# Patient Record
Sex: Female | Born: 1935 | Race: White | Hispanic: No | Smoking: Never smoker
Health system: Southern US, Community
[De-identification: ages and names within clinical notes are randomized; demographics above are authoritative.]

## PROBLEM LIST (undated history)

## (undated) DIAGNOSIS — E039 Hypothyroidism, unspecified: Secondary | ICD-10-CM

## (undated) DIAGNOSIS — F0390 Unspecified dementia without behavioral disturbance: Secondary | ICD-10-CM

## (undated) DIAGNOSIS — G250 Essential tremor: Secondary | ICD-10-CM

---

## 2018-05-30 ENCOUNTER — Emergency Department (HOSPITAL_COMMUNITY): Payer: Medicare Other

## 2018-05-30 ENCOUNTER — Emergency Department (HOSPITAL_COMMUNITY)
Admission: EM | Admit: 2018-05-30 | Discharge: 2018-05-30 | Disposition: A | Discharge status: Home / Self Care | Payer: Medicare Other | Attending: Emergency Medicine | Admitting: Emergency Medicine

## 2018-05-30 ENCOUNTER — Other Ambulatory Visit: Payer: Self-pay

## 2018-05-30 DIAGNOSIS — R0789 Other chest pain: Secondary | ICD-10-CM | POA: Diagnosis present

## 2018-05-30 LAB — COMPREHENSIVE METABOLIC PANEL
ALBUMIN: 3.2 g/dL — AB (ref 3.5–5.0)
ALK PHOS: 56 U/L (ref 38–126)
ALT: 13 U/L (ref 0–44)
ANION GAP: 8 (ref 5–15)
AST: 24 U/L (ref 15–41)
BILIRUBIN TOTAL: 0.8 mg/dL (ref 0.3–1.2)
BUN: 9 mg/dL (ref 8–23)
CALCIUM: 8.9 mg/dL (ref 8.9–10.3)
CO2: 25 mmol/L (ref 22–32)
CREATININE: 0.87 mg/dL (ref 0.44–1.00)
Chloride: 108 mmol/L (ref 98–111)
GFR calc Af Amer: >60 mL/min (ref >60)
GFR calc non Af Amer: 58 mL/min — ABNORMAL LOW (ref >60)
GLUCOSE: 97 mg/dL (ref 70–99)
Potassium: 3.9 mmol/L (ref 3.5–5.1)
SODIUM: 141 mmol/L (ref 135–145)
TOTAL PROTEIN: 6.6 g/dL (ref 6.5–8.1)

## 2018-05-30 LAB — CBC WITH DIFFERENTIAL/PLATELET
Abs Immature Granulocytes: 0 10*3/uL (ref 0.0–0.1)
BASOS ABS: 0.1 10*3/uL (ref 0.0–0.1)
BASOS PCT: 1 %
EOS ABS: 0.2 10*3/uL (ref 0.0–0.7)
EOS PCT: 3 %
HCT: 38.2 % (ref 36.0–46.0)
Hemoglobin: 12 g/dL (ref 12.0–15.0)
Immature Granulocytes: 0 %
Lymphocytes Relative: 21 %
Lymphs Abs: 1.3 10*3/uL (ref 0.7–4.0)
MCH: 29.1 pg (ref 26.0–34.0)
MCHC: 31.4 g/dL (ref 30.0–36.0)
MCV: 92.7 fL (ref 78.0–100.0)
MONO ABS: 0.6 10*3/uL (ref 0.1–1.0)
MONOS PCT: 9 %
NEUTROS ABS: 3.9 10*3/uL (ref 1.7–7.7)
Neutrophils Relative %: 66 %
Platelets: 222 10*3/uL (ref 150–400)
RBC: 4.12 MIL/uL (ref 3.87–5.11)
RDW: 14.6 % (ref 11.5–15.5)
WBC: 5.9 10*3/uL (ref 4.0–10.5)

## 2018-05-30 LAB — TROPONIN I: Troponin I: <0.03 ng/mL (ref <0.03)

## 2018-05-30 LAB — I-STAT TROPONIN, ED: TROPONIN I, POC: 0 ng/mL (ref 0.00–0.08)

## 2018-05-30 NOTE — ED Triage Notes (Signed)
[place]  Unassisted . Pt denies any CP.

## 2018-05-30 NOTE — ED Provider Notes (Signed)
Received the patient in signout from Dr. Madilyn Hookees, patient with atypical chest pain plan is for delta troponin.  Initial troponin is negative.  Awaiting second.  Repeat ECG unchanged from initial. Delta negative, d/c home.    EKG Interpretation  Date/Time:  Sunday May 30 2018 17:05:11 EDT Ventricular Rate:  69 PR Interval:    QRS Duration: 97 QT Interval:  408 QTC Calculation: 438 R Axis:   -24 Text Interpretation:  Sinus rhythm Borderline left axis deviation Abnormal R-wave progression, early transition Consider inferior infarct Baseline wander in lead(s) V2 No significant change since last tracing Confirmed by Melene PlanFloyd, Blayton Huttner [phone] on 05/30/2018 5:08:18 PM         Melene PlanFloyd, Tallie Hevia, DO 05/30/18 2320

## 2018-05-30 NOTE — ED Triage Notes (Signed)
Pt BIB [facility] from [facility] with reports of central CP for approx 1 min. CP resolved on its own. Pt now c/o generalized weakness/dizziness and L neck pain. 155/81, HR 58, RR16, 94% RA. Pt in NAD. EKG unremarkable.

## 2018-05-30 NOTE — ED Provider Notes (Signed)
[facility] EMERGENCY DEPARTMENT Provider Note   CSN: [id number] Arrival date & time: 05/30/18  1137     History   Chief Complaint Chief Complaint  Patient presents with  . Chest Pain    HPI Mia Rogers is a 84 y.o. female.  The history is provided by the patient and the EMS personnel. No language interpreter was used.  Chest Pain     Mia Rogers is a 84 y.o. female who presents to the Emergency Department complaining of chest pain.  Level V caveat due to confusion. He presents by EMS for evaluation of chest pain that occurred while she was walking her dog. Per EMS reports she had sharp central chest pain that lasted about 10 minutes. She also reported pain in the back of her neck. On ED arrival patient does not recall chest pain but the states she has chronic pain in the back of her neck. She does not recall events that occurred earlier today. [facility]. She reports not feeling well today but denies any additional symptoms.  Son, Mia Rogers. [phone] No past medical history on file.  There are no active problems to display for this patient.  past medical history of hypothyroidism and cognitive impairment  She is a non-smoker. No family history of cardiac disease.   She has a DNR. OB History   None      Home Medications    Prior to Admission medications   Not on File    Family History No family history on file.  Social History Social History   Tobacco Use  . Smoking status: Not on file  Substance Use Topics  . Alcohol use: Not on file  . Drug use: Not on file     Allergies   Patient has no allergy information on record.   Review of Systems Review of Systems  Cardiovascular: Positive for chest pain.  All other systems reviewed and are negative.    Physical Exam Updated Vital Signs BP (!) 156/77 (BP Location: Right Arm)   Pulse 72   Temp 98.2 F (36.8 C) (Oral)    Resp 17   Ht 5\' 3"  (1.6 m)   Wt 63.5 kg (140 lb)   SpO2 99%   BMI 24.80 kg/m   Physical Exam  Constitutional: She appears well-developed and well-nourished.  HENT:  Head: Normocephalic and atraumatic.  Cardiovascular: Normal rate and regular rhythm.  No murmur heard. Pulmonary/Chest: Effort normal and breath sounds normal. No respiratory distress.  Abdominal: Soft. There is no tenderness. There is no rebound and no guarding.  Musculoskeletal: She exhibits no edema or tenderness.  2+ radial pulses bilaterally  Neurological: She is alert.  Disoriented to place and time.  Resting tremor of head.    Skin: Skin is warm and dry.  Psychiatric: She has a normal mood and affect. Her behavior is normal.  Nursing note and vitals reviewed.    ED Treatments / Results  Labs (all labs ordered are listed, but only abnormal results are displayed) Labs Reviewed  COMPREHENSIVE METABOLIC PANEL - Abnormal; Notable for the following components:      Result Value   Albumin 3.2 (*)    GFR calc non Af Amer 58 (*)    All other components within normal limits  CBC WITH DIFFERENTIAL/PLATELET  TROPONIN I  I-STAT TROPONIN, ED    EKG EKG Interpretation  Date/Time:  Sunday May 30 2018  11:52:36 EDT Ventricular Rate:  56 PR Interval:    QRS Duration: 97 QT Interval:  447 QTC Calculation: 432 R Axis:   -20 Text Interpretation:  Sinus rhythm Inferior infarct, old Confirmed by Tilden Fossa [phone]) on 05/30/2018 12:10:19 PM   Radiology Dg Chest 2 View  Result Date: 05/30/2018 CLINICAL DATA:  [facility] with reports of central CP for approx 1 min. CP resolved on its own. Pt now c/o generalized weakness/dizziness and L neck pain. EXAM: CHEST - 2 VIEW COMPARISON:  None. FINDINGS: Heart size is UPPER limits normal. There is atherosclerotic calcification of the thoracic aorta. Mildly prominent interstitial markings. No overt alveolar edema or consolidation. There are numerous wedge  compression fractures in the UPPER thoracic spine. IMPRESSION: 1. Mildly prominent interstitial markings. Findings may be chronic. Doubt pulmonary edema. 2. Aortic atherosclerosis.  (ICD10-I70.0) 3. Thoracic compression fractures. Electronically Signed   By: Norva Pavlov M.D.   On: 05/30/2018 13:45    Procedures Procedures (including critical care time)  Medications Ordered in ED Medications - No data to display   Initial Impression / Assessment and Plan / ED Course  I have reviewed the triage vital signs and the nursing notes.  Pertinent labs & imaging results that were available during my care of the patient were reviewed by me and considered in my medical decision making (see chart for details).     Patient here for evaluation of 10 minutes of chest pain, but resolved on ED arrival. She has no current complaints in the emergency department. EKG with no acute ischemic changes. Presentation is not consistent with PE, dissection, pneumonia. Heart score of three if history is considered moderately suspicious. Patient has been able to ambulate the department with ease and without recurrent chest pain.  D/w patient and son option for repeat troponin following ED obs vs overnight observation in hospital - given patient has been symptom free and no recurrent chest pain plan to recheck troponin.  If enzyme is wnl plan to d/c home with close outpatient follow up.    Final Clinical Impressions(s) / ED Diagnoses   Final diagnoses:  Atypical chest pain    ED Discharge Orders    None       Tilden Fossa, MD 05/30/18 1615

## 2018-10-01 ENCOUNTER — Emergency Department (HOSPITAL_COMMUNITY): Payer: Medicare Other

## 2018-10-01 ENCOUNTER — Inpatient Hospital Stay (HOSPITAL_COMMUNITY)
Admission: EM | Admit: 2018-10-01 | Discharge: 2018-10-06 | DRG: 511 | Disposition: A | Discharge status: Skilled Nursing Facility | Payer: Medicare Other | Source: Skilled Nursing Facility | Attending: Family Medicine | Admitting: Family Medicine

## 2018-10-01 ENCOUNTER — Encounter (HOSPITAL_COMMUNITY): Payer: Self-pay | Admitting: Emergency Medicine

## 2018-10-01 ENCOUNTER — Other Ambulatory Visit: Payer: Self-pay

## 2018-10-01 DIAGNOSIS — Z79899 Other long term (current) drug therapy: Secondary | ICD-10-CM | POA: Diagnosis not present

## 2018-10-01 DIAGNOSIS — M79641 Pain in right hand: Secondary | ICD-10-CM | POA: Diagnosis present

## 2018-10-01 DIAGNOSIS — S0240CA Maxillary fracture, right side, initial encounter for closed fracture: Secondary | ICD-10-CM | POA: Diagnosis present

## 2018-10-01 DIAGNOSIS — R630 Anorexia: Secondary | ICD-10-CM | POA: Diagnosis present

## 2018-10-01 DIAGNOSIS — M25511 Pain in right shoulder: Secondary | ICD-10-CM | POA: Diagnosis present

## 2018-10-01 DIAGNOSIS — R079 Chest pain, unspecified: Secondary | ICD-10-CM | POA: Diagnosis present

## 2018-10-01 DIAGNOSIS — N179 Acute kidney failure, unspecified: Secondary | ICD-10-CM | POA: Diagnosis present

## 2018-10-01 DIAGNOSIS — F0390 Unspecified dementia without behavioral disturbance: Secondary | ICD-10-CM | POA: Diagnosis present

## 2018-10-01 DIAGNOSIS — W010XXA Fall on same level from slipping, tripping and stumbling without subsequent striking against object, initial encounter: Secondary | ICD-10-CM | POA: Diagnosis not present

## 2018-10-01 DIAGNOSIS — S52021D Displaced fracture of olecranon process without intraarticular extension of right ulna, subsequent encounter for closed fracture with routine healing: Secondary | ICD-10-CM | POA: Diagnosis not present

## 2018-10-01 DIAGNOSIS — R4182 Altered mental status, unspecified: Secondary | ICD-10-CM | POA: Diagnosis not present

## 2018-10-01 DIAGNOSIS — S52021A Displaced fracture of olecranon process without intraarticular extension of right ulna, initial encounter for closed fracture: Principal | ICD-10-CM

## 2018-10-01 DIAGNOSIS — Z23 Encounter for immunization: Secondary | ICD-10-CM | POA: Diagnosis present

## 2018-10-01 DIAGNOSIS — Y92099 Unspecified place in other non-institutional residence as the place of occurrence of the external cause: Secondary | ICD-10-CM | POA: Diagnosis not present

## 2018-10-01 DIAGNOSIS — G250 Essential tremor: Secondary | ICD-10-CM | POA: Diagnosis present

## 2018-10-01 DIAGNOSIS — Z79890 Hormone replacement therapy (postmenopausal): Secondary | ICD-10-CM

## 2018-10-01 DIAGNOSIS — M25561 Pain in right knee: Secondary | ICD-10-CM | POA: Diagnosis present

## 2018-10-01 DIAGNOSIS — E039 Hypothyroidism, unspecified: Secondary | ICD-10-CM | POA: Diagnosis present

## 2018-10-01 DIAGNOSIS — Y93K1 Activity, walking an animal: Secondary | ICD-10-CM | POA: Diagnosis not present

## 2018-10-01 DIAGNOSIS — S01111A Laceration without foreign body of right eyelid and periocular area, initial encounter: Secondary | ICD-10-CM | POA: Diagnosis present

## 2018-10-01 DIAGNOSIS — K219 Gastro-esophageal reflux disease without esophagitis: Secondary | ICD-10-CM | POA: Diagnosis present

## 2018-10-01 DIAGNOSIS — W19XXXA Unspecified fall, initial encounter: Secondary | ICD-10-CM | POA: Diagnosis present

## 2018-10-01 DIAGNOSIS — G9349 Other encephalopathy: Secondary | ICD-10-CM

## 2018-10-01 DIAGNOSIS — Z9181 History of falling: Secondary | ICD-10-CM | POA: Diagnosis not present

## 2018-10-01 HISTORY — DX: Essential tremor: G25.0

## 2018-10-01 HISTORY — DX: Unspecified dementia, unspecified severity, without behavioral disturbance, psychotic disturbance, mood disturbance, and anxiety: F03.90

## 2018-10-01 HISTORY — DX: Hypothyroidism, unspecified: E03.9

## 2018-10-01 LAB — CBC WITH DIFFERENTIAL/PLATELET
Abs Immature Granulocytes: 0.09 10*3/uL — ABNORMAL HIGH (ref 0.00–0.07)
BASOS ABS: 0.1 10*3/uL (ref 0.0–0.1)
BASOS PCT: 1 %
Eosinophils Absolute: 0.2 10*3/uL (ref 0.0–0.5)
Eosinophils Relative: 3 %
HEMATOCRIT: 40.9 % (ref 36.0–46.0)
HEMOGLOBIN: 12.7 g/dL (ref 12.0–15.0)
IMMATURE GRANULOCYTES: 1 %
LYMPHS ABS: 1.1 10*3/uL (ref 0.7–4.0)
Lymphocytes Relative: 15 %
MCH: 28.5 pg (ref 26.0–34.0)
MCHC: 31.1 g/dL (ref 30.0–36.0)
MCV: 91.7 fL (ref 80.0–100.0)
MONO ABS: 0.5 10*3/uL (ref 0.1–1.0)
MONOS PCT: 7 %
NEUTROS ABS: 5.5 10*3/uL (ref 1.7–7.7)
NEUTROS PCT: 73 %
Platelets: 296 10*3/uL (ref 150–400)
RBC: 4.46 MIL/uL (ref 3.87–5.11)
RDW: 14 % (ref 11.5–15.5)
WBC: 7.5 10*3/uL (ref 4.0–10.5)
nRBC: 0 % (ref 0.0–0.2)

## 2018-10-01 LAB — BASIC METABOLIC PANEL
ANION GAP: 10 (ref 5–15)
BUN: 9 mg/dL (ref 8–23)
CHLORIDE: 106 mmol/L (ref 98–111)
CO2: 24 mmol/L (ref 22–32)
Calcium: 9.3 mg/dL (ref 8.9–10.3)
Creatinine, Ser: 1.05 mg/dL — ABNORMAL HIGH (ref 0.44–1.00)
GFR calc Af Amer: 55 mL/min — ABNORMAL LOW (ref >60)
GFR calc non Af Amer: 48 mL/min — ABNORMAL LOW (ref >60)
Glucose, Bld: 109 mg/dL — ABNORMAL HIGH (ref 70–99)
Potassium: 3.9 mmol/L (ref 3.5–5.1)
Sodium: 140 mmol/L (ref 135–145)

## 2018-10-01 MED ORDER — CEFAZOLIN SODIUM-DEXTROSE 2-4 GM/100ML-% IV SOLN
2.0000 g | INTRAVENOUS | Status: AC
  Administered 2018-10-02: 2 g via INTRAVENOUS
  Filled 2018-10-01: qty 100

## 2018-10-01 MED ORDER — PROPRANOLOL HCL 20 MG PO TABS
40.0000 mg | ORAL_TABLET | Freq: Every day | ORAL | Status: DC
  Administered 2018-10-03 – 2018-10-05 (×3): 40 mg via ORAL
  Filled 2018-10-01 (×4): qty 2

## 2018-10-01 MED ORDER — TRAMADOL HCL 50 MG PO TABS: 50.0000 mg | ORAL_TABLET | Freq: Two times a day (BID) | ORAL | Status: DC | PRN

## 2018-10-01 MED ORDER — POLYETHYLENE GLYCOL 3350 17 G PO PACK
17.0000 g | PACK | Freq: Every day | ORAL | Status: DC
  Administered 2018-10-03 – 2018-10-05 (×3): 17 g via ORAL
  Filled 2018-10-01 (×4): qty 1

## 2018-10-01 MED ORDER — ACETAMINOPHEN 500 MG PO TABS
1000.0000 mg | ORAL_TABLET | Freq: Three times a day (TID) | ORAL | Status: DC
  Administered 2018-10-01 – 2018-10-06 (×14): 1000 mg via ORAL
  Filled 2018-10-01 (×14): qty 2

## 2018-10-01 MED ORDER — LEVOTHYROXINE SODIUM 50 MCG PO TABS
50.0000 ug | ORAL_TABLET | ORAL | Status: DC
  Administered 2018-10-03 – 2018-10-05 (×3): 50 ug via ORAL
  Filled 2018-10-01 (×3): qty 1

## 2018-10-01 MED ORDER — TETANUS-DIPHTH-ACELL PERTUSSIS 5-2.5-18.5 LF-MCG/0.5 IM SUSP
0.5000 mL | Freq: Once | INTRAMUSCULAR | Status: AC
  Administered 2018-10-01: 0.5 mL via INTRAMUSCULAR
  Filled 2018-10-01: qty 0.5

## 2018-10-01 MED ORDER — MORPHINE SULFATE (PF) 4 MG/ML IV SOLN
4.0000 mg | Freq: Once | INTRAVENOUS | Status: AC
  Administered 2018-10-01: 4 mg via INTRAVENOUS
  Filled 2018-10-01: qty 1

## 2018-10-01 MED ORDER — MORPHINE SULFATE (PF) 2 MG/ML IV SOLN
1.0000 mg | INTRAVENOUS | Status: DC | PRN
  Administered 2018-10-01: 1 mg via INTRAVENOUS
  Filled 2018-10-01: qty 1

## 2018-10-01 MED ORDER — CHLORHEXIDINE GLUCONATE 4 % EX LIQD
60.0000 mL | Freq: Once | CUTANEOUS | Status: AC
  Administered 2018-10-02: 4 via TOPICAL
  Filled 2018-10-01: qty 60

## 2018-10-01 MED ORDER — KETOROLAC TROMETHAMINE 15 MG/ML IJ SOLN: 15.0000 mg | Freq: Four times a day (QID) | INTRAMUSCULAR | Status: DC | PRN

## 2018-10-01 MED ORDER — QUETIAPINE FUMARATE 50 MG PO TABS
50.0000 mg | ORAL_TABLET | Freq: Every day | ORAL | Status: DC
  Administered 2018-10-01 – 2018-10-05 (×5): 50 mg via ORAL
  Filled 2018-10-01 (×6): qty 1

## 2018-10-01 MED ORDER — POVIDONE-IODINE 10 % EX SWAB: 2.0000 "application " | Freq: Once | CUTANEOUS | Status: DC

## 2018-10-01 MED ORDER — SENNA 8.6 MG PO TABS
1.0000 | ORAL_TABLET | Freq: Every day | ORAL | Status: DC
  Administered 2018-10-03 – 2018-10-04 (×2): 8.6 mg via ORAL
  Filled 2018-10-01 (×4): qty 1

## 2018-10-01 MED ORDER — LIDOCAINE HCL (PF) 1 % IJ SOLN
5.0000 mL | Freq: Once | INTRAMUSCULAR | Status: AC
  Administered 2018-10-01: 5 mL
  Filled 2018-10-01: qty 5

## 2018-10-01 MED ORDER — DONEPEZIL HCL 10 MG PO TABS
10.0000 mg | ORAL_TABLET | Freq: Every day | ORAL | Status: DC
  Administered 2018-10-01 – 2018-10-05 (×5): 10 mg via ORAL
  Filled 2018-10-01 (×5): qty 1

## 2018-10-01 NOTE — ED Triage Notes (Signed)
[facility] after unwitnessed fall in the parking lot, stating she tripped going up a stair. Pt denies LOC, does not take blood thinners, is c/o pain to L knee, R elbow and neck soreness. Has R eyebrow lac, bleeding controlled. BP 195/110, a&ox4

## 2018-10-01 NOTE — Consult Note (Signed)
Reason for Consult:Right elbow fx Referring Physician: Shondra Capps is an 84 y.o. female.  HPI: Mia Rogers was walking up the steps to her ALF after walking her dog and tripped and fell onto her right side. She had immediate facial and elbow pain. She was brought to the ED where workup showed facial fxs and a right olecranon fx and orthopedic surgery was consulted. She c/o localized pain in the location of the fxs.  History reviewed. No pertinent past medical history.  History reviewed. No pertinent surgical history.  No family history on file.  Social History:  reports that she has never smoked. She has never used smokeless tobacco. She reports that she does not drink alcohol or use drugs.  Allergies: No Known Allergies  Medications: I have reviewed the patient's current medications.  Results for orders placed or performed during the hospital encounter of 10/01/18 (from the past 48 hour(s))  CBC with Differential     Status: Abnormal   Collection Time: 10/01/18  9:50 AM  Result Value Ref Range   WBC 7.5 4.0 - 10.5 K/uL   RBC 4.46 3.87 - 5.11 MIL/uL   Hemoglobin 12.7 12.0 - 15.0 g/dL   HCT 16.1 09.6 - 04.5 %   MCV 91.7 80.0 - 100.0 fL   MCH 28.5 26.0 - 34.0 pg   MCHC 31.1 30.0 - 36.0 g/dL   RDW 40.9 81.1 - 91.4 %   Platelets 296 150 - 400 K/uL   nRBC 0.0 0.0 - 0.2 %   Neutrophils Relative % 73 %   Neutro Abs 5.5 1.7 - 7.7 K/uL   Lymphocytes Relative 15 %   Lymphs Abs 1.1 0.7 - 4.0 K/uL   Monocytes Relative 7 %   Monocytes Absolute 0.5 0.1 - 1.0 K/uL   Eosinophils Relative 3 %   Eosinophils Absolute 0.2 0.0 - 0.5 K/uL   Basophils Relative 1 %   Basophils Absolute 0.1 0.0 - 0.1 K/uL   Immature Granulocytes 1 %   Abs Immature Granulocytes 0.09 (H) 0.00 - 0.07 K/uL    Comment: Performed at [facility], [address] [place], [place] [address]  Basic metabolic panel     Status: Abnormal   Collection Time: 10/01/18  9:50 AM  Result Value Ref Range   Sodium  140 135 - 145 mmol/L   Potassium 3.9 3.5 - 5.1 mmol/L   Chloride 106 98 - 111 mmol/L   CO2 24 22 - 32 mmol/L   Glucose, Bld 109 (H) 70 - 99 mg/dL   BUN 9 8 - 23 mg/dL   Creatinine, Ser 6.21 (H) 0.44 - 1.00 mg/dL   Calcium 9.3 8.9 - 30.8 mg/dL   GFR calc non Af Amer 48 (L) >60 mL/min   GFR calc Af Amer 55 (L) >60 mL/min   Anion gap 10 5 - 15    Comment: Performed at [facility], [address], [place], [place] [address]    Dg Ribs Unilateral W/chest Right  Result Date: 10/01/2018 CLINICAL DATA:  Recent fall with right-sided chest pain, initial encounter EXAM: RIGHT RIBS AND CHEST - 3+ VIEW COMPARISON:  05/30/2018 FINDINGS: Cardiac shadow is enlarged. Aortic calcifications are seen. No acute rib fracture is noted. Lung is well aerated. Stable compression deformities in the thoracic spine are seen. IMPRESSION: No acute rib fracture noted. Electronically Signed   By: Alcide Clever M.D.   On: 10/01/2018 10:53   Dg Shoulder Right  Result Date: 10/01/2018  CLINICAL DATA:  Recent fall today with right shoulder pain, initial encounter EXAM: RIGHT SHOULDER - 2+ VIEW COMPARISON:  None. FINDINGS: Degenerative changes of the acromioclavicular joint are seen. No acute fracture or dislocation is noted. The underlying bony thorax appears within normal limits. No soft tissue changes are seen. IMPRESSION: Degenerative change without acute abnormality. Electronically Signed   By: Alcide Clever M.D.   On: 10/01/2018 10:55   Dg Elbow Complete Right  Result Date: 10/01/2018 CLINICAL DATA:  Recent fall with elbow pain, initial encounter EXAM: RIGHT ELBOW - COMPLETE 3+ VIEW COMPARISON:  None. FINDINGS: There is an olecranon fracture with distraction of the proximal fracture fragment by approximately 1 cm. Joint effusion is noted. No other fractures are seen. A small bony density is noted medially on the frontal films likely related to a fragment from the olecranon. IMPRESSION: Olecranon fracture with  distraction. Electronically Signed   By: Alcide Clever M.D.   On: 10/01/2018 10:51   Ct Head Wo Contrast  Result Date: 10/01/2018 CLINICAL DATA:  84 year old female with head, neck and facial pain following fall. RIGHT facial hematoma. EXAM: CT HEAD WITHOUT CONTRAST CT MAXILLOFACIAL WITHOUT CONTRAST CT CERVICAL SPINE WITHOUT CONTRAST TECHNIQUE: Multidetector CT imaging of the head, cervical spine, and maxillofacial structures were performed using the standard protocol without intravenous contrast. Multiplanar CT image reconstructions of the cervical spine and maxillofacial structures were also generated. COMPARISON:  None. FINDINGS: CT HEAD FINDINGS Brain: No evidence of acute infarction, hemorrhage, hydrocephalus, extra-axial collection or mass lesion/mass effect. Atrophy and mild probable chronic small-vessel white matter ischemic changes noted. Vascular: Carotid atherosclerotic calcifications noted. Skull: No acute calvarial abnormality identified. Other: RIGHT forehead soft tissue swelling identified. CT MAXILLOFACIAL FINDINGS Osseous: Fractures of the ANTERIOR, LATERAL and MEDIAL RIGHT maxillary sinus walls noted. There is 1.5 mm displacement of the ANTERIOR RIGHT maxillary sinus wall fracture. The other fractures are nondisplaced. No other acute fracture, subluxation or dislocation identified. Orbits: Negative. No traumatic or inflammatory finding. Sinuses: Fluid in the RIGHT maxillary and bilateral sphenoid sinuses noted. Soft tissues: RIGHT facial soft tissue swelling identified. CT CERVICAL SPINE FINDINGS Alignment: Normal. Skull base and vertebrae: No acute fracture. No primary bone lesion or focal pathologic process. Soft tissues and spinal canal: No prevertebral fluid or swelling. No visible canal hematoma. Disc levels: Moderate to severe multilevel degenerative disc disease and facet arthropathy noted. Upper chest: No acute abnormality Other: None IMPRESSION: 1. No evidence of acute intracranial  abnormality. Atrophy and probable mild chronic small-vessel white matter ischemic changes. 2. Fractures of the ANTERIOR, LATERAL and MEDIAL walls of the RIGHT maxillary sinus with overlying soft tissue swelling. 3. Small amount of fluid within the sphenoid sinuses without sphenoid or temporal bone fractures identified. 4. No static evidence of acute injury to the cervical spine. Moderate to severe multilevel degenerative changes. Electronically Signed   By: Harmon Pier M.D.   On: 10/01/2018 11:18   Ct Cervical Spine Wo Contrast  Result Date: 10/01/2018 CLINICAL DATA:  84 year old female with head, neck and facial pain following fall. RIGHT facial hematoma. EXAM: CT HEAD WITHOUT CONTRAST CT MAXILLOFACIAL WITHOUT CONTRAST CT CERVICAL SPINE WITHOUT CONTRAST TECHNIQUE: Multidetector CT imaging of the head, cervical spine, and maxillofacial structures were performed using the standard protocol without intravenous contrast. Multiplanar CT image reconstructions of the cervical spine and maxillofacial structures were also generated. COMPARISON:  None. FINDINGS: CT HEAD FINDINGS Brain: No evidence of acute infarction, hemorrhage, hydrocephalus, extra-axial collection or mass lesion/mass effect. Atrophy and mild probable  chronic small-vessel white matter ischemic changes noted. Vascular: Carotid atherosclerotic calcifications noted. Skull: No acute calvarial abnormality identified. Other: RIGHT forehead soft tissue swelling identified. CT MAXILLOFACIAL FINDINGS Osseous: Fractures of the ANTERIOR, LATERAL and MEDIAL RIGHT maxillary sinus walls noted. There is 1.5 mm displacement of the ANTERIOR RIGHT maxillary sinus wall fracture. The other fractures are nondisplaced. No other acute fracture, subluxation or dislocation identified. Orbits: Negative. No traumatic or inflammatory finding. Sinuses: Fluid in the RIGHT maxillary and bilateral sphenoid sinuses noted. Soft tissues: RIGHT facial soft tissue swelling identified. CT  CERVICAL SPINE FINDINGS Alignment: Normal. Skull base and vertebrae: No acute fracture. No primary bone lesion or focal pathologic process. Soft tissues and spinal canal: No prevertebral fluid or swelling. No visible canal hematoma. Disc levels: Moderate to severe multilevel degenerative disc disease and facet arthropathy noted. Upper chest: No acute abnormality Other: None IMPRESSION: 1. No evidence of acute intracranial abnormality. Atrophy and probable mild chronic small-vessel white matter ischemic changes. 2. Fractures of the ANTERIOR, LATERAL and MEDIAL walls of the RIGHT maxillary sinus with overlying soft tissue swelling. 3. Small amount of fluid within the sphenoid sinuses without sphenoid or temporal bone fractures identified. 4. No static evidence of acute injury to the cervical spine. Moderate to severe multilevel degenerative changes. Electronically Signed   By: Harmon Pier M.D.   On: 10/01/2018 11:18   Dg Knee Complete 4 Views Right  Result Date: 10/01/2018 CLINICAL DATA:  Right knee pain following fall today, initial encounter EXAM: RIGHT KNEE - COMPLETE 4+ VIEW COMPARISON:  None. FINDINGS: No evidence of fracture, dislocation, or joint effusion. No evidence of arthropathy or other focal bone abnormality. Soft tissues are unremarkable. IMPRESSION: No acute fracture noted. Electronically Signed   By: Alcide Clever M.D.   On: 10/01/2018 10:54   Dg Hand Complete Right  Result Date: 10/01/2018 CLINICAL DATA:  Recent fall with right hand pain, initial encounter EXAM: RIGHT HAND - COMPLETE 3+ VIEW COMPARISON:  None. FINDINGS: Mild degenerative changes are noted most prominent at the third MCP joint as well as in the radiocarpal articulation. No fracture or dislocation is seen. No gross soft tissue abnormality is noted. IMPRESSION: Degenerative changes without acute abnormality. Electronically Signed   By: Alcide Clever M.D.   On: 10/01/2018 10:53   Dg Hip Unilat W Or Wo Pelvis 2-3 Views  Right  Result Date: 10/01/2018 CLINICAL DATA:  Recent fall with right hip pain, initial encounter EXAM: DG HIP (WITH OR WITHOUT PELVIS) 2-3V RIGHT COMPARISON:  None. FINDINGS: There are changes consistent with prior superior and inferior pubic rami fractures with healing bilaterally. No acute fracture or dislocation is seen. No soft tissue abnormality is noted. IMPRESSION: Old fractures of the pubic rami bilaterally. No acute abnormality noted. Electronically Signed   By: Alcide Clever M.D.   On: 10/01/2018 10:54   Ct Maxillofacial Wo Cm  Result Date: 10/01/2018 CLINICAL DATA:  84 year old female with head, neck and facial pain following fall. RIGHT facial hematoma. EXAM: CT HEAD WITHOUT CONTRAST CT MAXILLOFACIAL WITHOUT CONTRAST CT CERVICAL SPINE WITHOUT CONTRAST TECHNIQUE: Multidetector CT imaging of the head, cervical spine, and maxillofacial structures were performed using the standard protocol without intravenous contrast. Multiplanar CT image reconstructions of the cervical spine and maxillofacial structures were also generated. COMPARISON:  None. FINDINGS: CT HEAD FINDINGS Brain: No evidence of acute infarction, hemorrhage, hydrocephalus, extra-axial collection or mass lesion/mass effect. Atrophy and mild probable chronic small-vessel white matter ischemic changes noted. Vascular: Carotid atherosclerotic calcifications noted. Skull: No  acute calvarial abnormality identified. Other: RIGHT forehead soft tissue swelling identified. CT MAXILLOFACIAL FINDINGS Osseous: Fractures of the ANTERIOR, LATERAL and MEDIAL RIGHT maxillary sinus walls noted. There is 1.5 mm displacement of the ANTERIOR RIGHT maxillary sinus wall fracture. The other fractures are nondisplaced. No other acute fracture, subluxation or dislocation identified. Orbits: Negative. No traumatic or inflammatory finding. Sinuses: Fluid in the RIGHT maxillary and bilateral sphenoid sinuses noted. Soft tissues: RIGHT facial soft tissue swelling  identified. CT CERVICAL SPINE FINDINGS Alignment: Normal. Skull base and vertebrae: No acute fracture. No primary bone lesion or focal pathologic process. Soft tissues and spinal canal: No prevertebral fluid or swelling. No visible canal hematoma. Disc levels: Moderate to severe multilevel degenerative disc disease and facet arthropathy noted. Upper chest: No acute abnormality Other: None IMPRESSION: 1. No evidence of acute intracranial abnormality. Atrophy and probable mild chronic small-vessel white matter ischemic changes. 2. Fractures of the ANTERIOR, LATERAL and MEDIAL walls of the RIGHT maxillary sinus with overlying soft tissue swelling. 3. Small amount of fluid within the sphenoid sinuses without sphenoid or temporal bone fractures identified. 4. No static evidence of acute injury to the cervical spine. Moderate to severe multilevel degenerative changes. Electronically Signed   By: Harmon PierJeffrey  Hu M.D.   On: 10/01/2018 11:18    Review of Systems  Constitutional: Negative for weight loss.  HENT: Negative for ear discharge, ear pain, hearing loss and tinnitus.   Eyes: Negative for blurred vision, double vision, photophobia and pain.  Respiratory: Negative for cough, sputum production and shortness of breath.   Cardiovascular: Negative for chest pain.  Gastrointestinal: Negative for abdominal pain, nausea and vomiting.  Genitourinary: Negative for dysuria, flank pain, frequency and urgency.  Musculoskeletal: Positive for joint pain (Right elbow). Negative for back pain, falls, myalgias and neck pain.  Neurological: Negative for dizziness, tingling, sensory change, focal weakness, loss of consciousness and headaches.  Endo/Heme/Allergies: Does not bruise/bleed easily.  Psychiatric/Behavioral: Negative for depression, memory loss and substance abuse. The patient is not nervous/anxious.    Blood pressure (!) 181/100, pulse 66, resp. rate 12, height 5\' 7"  (1.702 m), weight 59 kg, SpO2 100 %. Physical  Exam  Constitutional: She appears well-developed and well-nourished. No distress.  HENT:  Head: Normocephalic.  Eyes: Conjunctivae are normal. Right eye exhibits no discharge. Left eye exhibits no discharge. No scleral icterus.  Neck: Normal range of motion.  Cardiovascular: Normal rate and regular rhythm.  Respiratory: Effort normal. No respiratory distress.  Musculoskeletal:  Right shoulder, elbow, wrist, digits- no skin wounds, TTP elbow, no instability, no blocks to motion except pain  Sens  Ax/R/M/U intact  Mot   Ax/ R/ PIN/ M/ AIN/ U intact  Rad 2+  Neurological: She is alert.  Skin: Skin is warm and dry. She is not diaphoretic.  Psychiatric: She has a normal mood and affect. Her behavior is normal.    Assessment/Plan: Fall Right elbow fx -- Will need ORIF, plan tomorrow morning by Dr. Eulah PontMurphy. NPO after MN. Will place in splint overnight. Facial fxs -- Awaiting ENT consult Dementia -- Medicine to admit    Freeman CaldronMichael J. Kaulana Brindle, PA-C Orthopedic Surgery [phone] 10/01/2018, 12:12 PM

## 2018-10-01 NOTE — H&P (Addendum)
Family Medicine Teaching [facility] Admission History and Physical Service Pager: [phone]  Patient name: Mia Rogers Medical record number: [id number] Date of birth: 1928-09-07 Age: 84 y.o. Gender: female  Primary Care Provider: Manus Gunning, FNP Consultants: surgery, ENT Code Status: full  Chief Complaint: mechanical fall w/ right arm and facial fracture.   Assessment and Plan: Mia Rogers is a 85 y.o. female presenting after a mechanical fall with maxillary fractures and olecranon fracture. Marland Kitchen PMH is significant for hypothyroid, dementia.   Mechanical fall leading to maxillary and olecranon fracture - patient fell while walking her dog today.  Not a syncopal episode.  CT cervical spine, maxillofacial, head, Xray of knee, hip, hand, ribs, elbow, shoulder show No fractures other than right olecranon and maxilla. Facial fracture is anterior, lateral, and medial walls of the right maxillary sinus. DG elbow showed olecranon fracture with distration.  No sign of hemorrhage.  Full ROM in other 3 extremities. Patient has remote history of fall 4 years ago in similar situation in which she fractured her pelvis. Managing patient's pain while patient awaits surgery.  - admit to [facility] teaching service, Dr. Pollie Meyer attending.  - pain control: tylenol scheudled TID, 1mg  q4h PRN.  - SCDs  - NPO @ MN - surgery consulted, will operate tomorrow.  - ENT consulted, no surgical intervention necessary for face.  - tetanus shot - AM PTT, INR, CBC, BMP  Hypothyroidism - patient has history of hypothyroid. Taking daily at home. No TSH available in epic but patient does follow with PCP at her living facility per her son at bedside. - continue home dose  Dementia - patient has memory loss, was repeating herself at times.  Son was in room and confirmed pre-exissting memory issues.  Patient was able to remember the events right before her fall.  Patient states she has a headache but does not  appear to have suffered a concussion or loss of consciousness. Taking aricept and seroquel at home.  - son is HCPOA - continue aricept.  - continue seroquel  Essential tremor - patient has a history of essential tremor affecting the head, neck and arms that is visible on exam.  She takes propranolol for this at home.  She has not taken this medicine today.  - propanolol - continue  FEN/GI: none.  Regular diet.  NPO at midnight before sugery.  Prophylaxis: none  Disposition: medsurg  History of Present Illness:  Mia Rogers is a 84 y.o. female presenting with  Post mech fall resulting in olecranon fx, maxillary fx. PMH of dementia, hypothyroid, essential tremor.    Patient was walking her dog this morning at her assisted living home ([facility] )and tripped over the curb.  She fell on her right side and landed on her elbow and hit the right side of her head.  She does not think she lost consciousness. She denies prodromes, denies dizziness or chest pain. Denies dizziness. She feels she remembers the entire event. According to her son she was able to get up and get inside the lobby before collapsing Due to discomfort.  From that point she was taken by ambulance to the hospital. She had one episode of mechanical fall 4 years ago and fractured her pelvis.  Otherwise her son reports that she is very active and has not had other episodes of fall, no episodes of syncope.  Son states she has a history of dementia, hypothyroid, and essential tremor.     Review Of Systems:  Review of Systems  Constitutional: Negative for fever.  HENT: Negative for congestion and nosebleeds.   Eyes: Positive for pain.  Respiratory: Negative for cough and shortness of breath.   Cardiovascular: Negative for chest pain.  Gastrointestinal: Negative for abdominal pain, nausea and vomiting.  Neurological: Positive for headaches. Negative for dizziness and weakness.    Patient Active Problem List    Diagnosis Date Noted  . Fall 10/01/2018    Past Medical History: Past Medical History:  Diagnosis Date  . Dementia (HCC)   . Essential tremor   . Hypothyroid     Past Surgical History: History reviewed. No pertinent surgical history.  Social History: Social History   Tobacco Use  . Smoking status: Never Smoker  . Smokeless tobacco: Never Used  Substance Use Topics  . Alcohol use: Never    Frequency: Never  . Drug use: Never   Additional social history:    Please also refer to relevant sections of EMR.  Family History: No family history on file.  Allergies and Medications: No Known Allergies No current facility-administered medications on file prior to encounter.    Current Outpatient Medications on File Prior to Encounter  Medication Sig Dispense Refill  . donepezil (ARICEPT) 10 MG tablet Take 10 mg by mouth at bedtime.    Marland Kitchen. ibuprofen (ADVIL,MOTRIN) 200 MG tablet Take 400 mg by mouth every 8 (eight) hours as needed (pain).    Marland Kitchen. levothyroxine (SYNTHROID, LEVOTHROID) 50 MCG tablet Take 50 mcg by mouth See admin instructions. Take one tablet (50 mcg) by mouth Sunday, Monday, Tuesday, Thursday, Friday Saturday at 6am (skip Wednesday)    . Multiple Vitamin (DAILY VITE) TABS Take 1 tablet by mouth daily.    . propranolol (INDERAL) 40 MG tablet Take 40 mg by mouth daily at 6 (six) AM.    . QUEtiapine (SEROQUEL) 25 MG tablet Take 50 mg by mouth at bedtime.       Objective: BP (!) 170/113   Pulse 66   Resp 20   Ht 5\' 7"  (1.702 m)   Wt 59 kg   SpO2 100%   BMI 20.36 kg/m  Exam: General: alert. No acute distress. Pleasant. Forgetful.  Eyes: PERRL, constricted pupils. EOMI. Bruising, repaired laceration around right orbital area.  No subconjuctival hemorrhage.  ENTM: no oropharyngeal erythema, but appears some blood in the back of her throat.  Neck: full ROM.  No TTP.   Cardiovascular: Regular rhythm. Normal rate.  No murmurs. 2+ radial pulse bilaterally.  Respiratory:  lungs clear to auscultation anteriorally.  No wheezes, murmurs.  Gastrointestinal: nontender to palpation.  Normal bowel sounds.   MSK: full ROM in LUE, RLE, LLE.  No tenderness to palpation in LUE, RLE, LLE.  Limited ROM on the rightdue to pain.  Bruising on the right elbow.  TTP.   Derm: hematoma on right orbital region and right olecrenon. Sutures noted. No drainage. No bruises elsewhere.  Neuro: CN grossly intact.   Psych: patient has pleasant affect. Has some short term memory deficits but is alert and oriented x 4.    Labs and Imaging: CBC BMET  Recent Labs  Lab 10/01/18 0950  WBC 7.5  HGB 12.7  HCT 40.9  PLT 296   Recent Labs  Lab 10/01/18 0950  NA 140  K 3.9  CL 106  CO2 24  BUN 9  CREATININE 1.05*  GLUCOSE 109*  CALCIUM 9.3       Sandre Kittylson, Daniel K, MD 10/01/2018, 3:59 PM PGY-1, [facility] FPTS Intern pager: [phone], text pages welcome  I have separately seen and examined the patient. I have discussed the findings and exam with Dr. Constance Goltz  and agree with the above note.  My changes/additions are outlined in BLUE.   Howard Pouch, MD PGY-3 [facility]

## 2018-10-01 NOTE — H&P (View-Only) (Signed)
Reason for Consult:Right elbow fx Referring Physician: S Goldston  Mia Rogers is an 84 y.o. female.  HPI: [name] was walking up the steps to her ALF after walking her dog and tripped and fell onto her right side. She had immediate facial and elbow pain. She was brought to the ED where workup showed facial fxs and a right olecranon fx and orthopedic surgery was consulted. She c/o localized pain in the location of the fxs.  History reviewed. No pertinent past medical history.  History reviewed. No pertinent surgical history.  No family history on file.  Social History:  reports that she has never smoked. She has never used smokeless tobacco. She reports that she does not drink alcohol or use drugs.  Allergies: No Known Allergies  Medications: I have reviewed the patient's current medications.  Results for orders placed or performed during the hospital encounter of 10/01/18 (from the past 48 hour(s))  CBC with Differential     Status: Abnormal   Collection Time: 10/01/18  9:50 AM  Result Value Ref Range   WBC 7.5 4.0 - 10.5 K/uL   RBC 4.46 3.87 - 5.11 MIL/uL   Hemoglobin 12.7 12.0 - 15.0 g/dL   HCT 40.9 36.0 - 46.0 %   MCV 91.7 80.0 - 100.0 fL   MCH 28.5 26.0 - 34.0 pg   MCHC 31.1 30.0 - 36.0 g/dL   RDW 14.0 11.5 - 15.5 %   Platelets 296 150 - 400 K/uL   nRBC 0.0 0.0 - 0.2 %   Neutrophils Relative % 73 %   Neutro Abs 5.5 1.7 - 7.7 K/uL   Lymphocytes Relative 15 %   Lymphs Abs 1.1 0.7 - 4.0 K/uL   Monocytes Relative 7 %   Monocytes Absolute 0.5 0.1 - 1.0 K/uL   Eosinophils Relative 3 %   Eosinophils Absolute 0.2 0.0 - 0.5 K/uL   Basophils Relative 1 %   Basophils Absolute 0.1 0.0 - 0.1 K/uL   Immature Granulocytes 1 %   Abs Immature Granulocytes 0.09 (H) 0.00 - 0.07 K/uL    Comment: Performed at [facility], [address] [place], [place] [address]  Basic metabolic panel     Status: Abnormal   Collection Time: 10/01/18  9:50 AM  Result Value Ref Range   Sodium  140 135 - 145 mmol/L   Potassium 3.9 3.5 - 5.1 mmol/L   Chloride 106 98 - 111 mmol/L   CO2 24 22 - 32 mmol/L   Glucose, Bld 109 (H) 70 - 99 mg/dL   BUN 9 8 - 23 mg/dL   Creatinine, Ser 1.05 (H) 0.44 - 1.00 mg/dL   Calcium 9.3 8.9 - 10.3 mg/dL   GFR calc non Af Amer 48 (L) >60 mL/min   GFR calc Af Amer 55 (L) >60 mL/min   Anion gap 10 5 - 15    Comment: Performed at [facility], [address] [place], [place] [address]    Dg Ribs Unilateral W/chest Right  Result Date: 10/01/2018 CLINICAL DATA:  Recent fall with right-sided chest pain, initial encounter EXAM: RIGHT RIBS AND CHEST - 3+ VIEW COMPARISON:  05/30/2018 FINDINGS: Cardiac shadow is enlarged. Aortic calcifications are seen. No acute rib fracture is noted. Lung is well aerated. Stable compression deformities in the thoracic spine are seen. IMPRESSION: No acute rib fracture noted. Electronically Signed   By: Mark  Lukens M.D.   On: 10/01/2018 10:53   Dg Shoulder Right  Result Date: 10/01/2018   CLINICAL DATA:  Recent fall today with right shoulder pain, initial encounter EXAM: RIGHT SHOULDER - 2+ VIEW COMPARISON:  None. FINDINGS: Degenerative changes of the acromioclavicular joint are seen. No acute fracture or dislocation is noted. The underlying bony thorax appears within normal limits. No soft tissue changes are seen. IMPRESSION: Degenerative change without acute abnormality. Electronically Signed   By: Mark  Lukens M.D.   On: 10/01/2018 10:55   Dg Elbow Complete Right  Result Date: 10/01/2018 CLINICAL DATA:  Recent fall with elbow pain, initial encounter EXAM: RIGHT ELBOW - COMPLETE 3+ VIEW COMPARISON:  None. FINDINGS: There is an olecranon fracture with distraction of the proximal fracture fragment by approximately 1 cm. Joint effusion is noted. No other fractures are seen. A small bony density is noted medially on the frontal films likely related to a fragment from the olecranon. IMPRESSION: Olecranon fracture with  distraction. Electronically Signed   By: Mark  Lukens M.D.   On: 10/01/2018 10:51   Ct Head Wo Contrast  Result Date: 10/01/2018 CLINICAL DATA:  84-year-old female with head, neck and facial pain following fall. RIGHT facial hematoma. EXAM: CT HEAD WITHOUT CONTRAST CT MAXILLOFACIAL WITHOUT CONTRAST CT CERVICAL SPINE WITHOUT CONTRAST TECHNIQUE: Multidetector CT imaging of the head, cervical spine, and maxillofacial structures were performed using the standard protocol without intravenous contrast. Multiplanar CT image reconstructions of the cervical spine and maxillofacial structures were also generated. COMPARISON:  None. FINDINGS: CT HEAD FINDINGS Brain: No evidence of acute infarction, hemorrhage, hydrocephalus, extra-axial collection or mass lesion/mass effect. Atrophy and mild probable chronic small-vessel white matter ischemic changes noted. Vascular: Carotid atherosclerotic calcifications noted. Skull: No acute calvarial abnormality identified. Other: RIGHT forehead soft tissue swelling identified. CT MAXILLOFACIAL FINDINGS Osseous: Fractures of the ANTERIOR, LATERAL and MEDIAL RIGHT maxillary sinus walls noted. There is 1.5 mm displacement of the ANTERIOR RIGHT maxillary sinus wall fracture. The other fractures are nondisplaced. No other acute fracture, subluxation or dislocation identified. Orbits: Negative. No traumatic or inflammatory finding. Sinuses: Fluid in the RIGHT maxillary and bilateral sphenoid sinuses noted. Soft tissues: RIGHT facial soft tissue swelling identified. CT CERVICAL SPINE FINDINGS Alignment: Normal. Skull base and vertebrae: No acute fracture. No primary bone lesion or focal pathologic process. Soft tissues and spinal canal: No prevertebral fluid or swelling. No visible canal hematoma. Disc levels: Moderate to severe multilevel degenerative disc disease and facet arthropathy noted. Upper chest: No acute abnormality Other: None IMPRESSION: 1. No evidence of acute intracranial  abnormality. Atrophy and probable mild chronic small-vessel white matter ischemic changes. 2. Fractures of the ANTERIOR, LATERAL and MEDIAL walls of the RIGHT maxillary sinus with overlying soft tissue swelling. 3. Small amount of fluid within the sphenoid sinuses without sphenoid or temporal bone fractures identified. 4. No static evidence of acute injury to the cervical spine. Moderate to severe multilevel degenerative changes. Electronically Signed   By: Jeffrey  Hu M.D.   On: 10/01/2018 11:18   Ct Cervical Spine Wo Contrast  Result Date: 10/01/2018 CLINICAL DATA:  84-year-old female with head, neck and facial pain following fall. RIGHT facial hematoma. EXAM: CT HEAD WITHOUT CONTRAST CT MAXILLOFACIAL WITHOUT CONTRAST CT CERVICAL SPINE WITHOUT CONTRAST TECHNIQUE: Multidetector CT imaging of the head, cervical spine, and maxillofacial structures were performed using the standard protocol without intravenous contrast. Multiplanar CT image reconstructions of the cervical spine and maxillofacial structures were also generated. COMPARISON:  None. FINDINGS: CT HEAD FINDINGS Brain: No evidence of acute infarction, hemorrhage, hydrocephalus, extra-axial collection or mass lesion/mass effect. Atrophy and mild probable   chronic small-vessel white matter ischemic changes noted. Vascular: Carotid atherosclerotic calcifications noted. Skull: No acute calvarial abnormality identified. Other: RIGHT forehead soft tissue swelling identified. CT MAXILLOFACIAL FINDINGS Osseous: Fractures of the ANTERIOR, LATERAL and MEDIAL RIGHT maxillary sinus walls noted. There is 1.5 mm displacement of the ANTERIOR RIGHT maxillary sinus wall fracture. The other fractures are nondisplaced. No other acute fracture, subluxation or dislocation identified. Orbits: Negative. No traumatic or inflammatory finding. Sinuses: Fluid in the RIGHT maxillary and bilateral sphenoid sinuses noted. Soft tissues: RIGHT facial soft tissue swelling identified. CT  CERVICAL SPINE FINDINGS Alignment: Normal. Skull base and vertebrae: No acute fracture. No primary bone lesion or focal pathologic process. Soft tissues and spinal canal: No prevertebral fluid or swelling. No visible canal hematoma. Disc levels: Moderate to severe multilevel degenerative disc disease and facet arthropathy noted. Upper chest: No acute abnormality Other: None IMPRESSION: 1. No evidence of acute intracranial abnormality. Atrophy and probable mild chronic small-vessel white matter ischemic changes. 2. Fractures of the ANTERIOR, LATERAL and MEDIAL walls of the RIGHT maxillary sinus with overlying soft tissue swelling. 3. Small amount of fluid within the sphenoid sinuses without sphenoid or temporal bone fractures identified. 4. No static evidence of acute injury to the cervical spine. Moderate to severe multilevel degenerative changes. Electronically Signed   By: Jeffrey  Hu M.D.   On: 10/01/2018 11:18   Dg Knee Complete 4 Views Right  Result Date: 10/01/2018 CLINICAL DATA:  Right knee pain following fall today, initial encounter EXAM: RIGHT KNEE - COMPLETE 4+ VIEW COMPARISON:  None. FINDINGS: No evidence of fracture, dislocation, or joint effusion. No evidence of arthropathy or other focal bone abnormality. Soft tissues are unremarkable. IMPRESSION: No acute fracture noted. Electronically Signed   By: Mark  Lukens M.D.   On: 10/01/2018 10:54   Dg Hand Complete Right  Result Date: 10/01/2018 CLINICAL DATA:  Recent fall with right hand pain, initial encounter EXAM: RIGHT HAND - COMPLETE 3+ VIEW COMPARISON:  None. FINDINGS: Mild degenerative changes are noted most prominent at the third MCP joint as well as in the radiocarpal articulation. No fracture or dislocation is seen. No gross soft tissue abnormality is noted. IMPRESSION: Degenerative changes without acute abnormality. Electronically Signed   By: Mark  Lukens M.D.   On: 10/01/2018 10:53   Dg Hip Unilat W Or Wo Pelvis 2-3 Views  Right  Result Date: 10/01/2018 CLINICAL DATA:  Recent fall with right hip pain, initial encounter EXAM: DG HIP (WITH OR WITHOUT PELVIS) 2-3V RIGHT COMPARISON:  None. FINDINGS: There are changes consistent with prior superior and inferior pubic rami fractures with healing bilaterally. No acute fracture or dislocation is seen. No soft tissue abnormality is noted. IMPRESSION: Old fractures of the pubic rami bilaterally. No acute abnormality noted. Electronically Signed   By: Mark  Lukens M.D.   On: 10/01/2018 10:54   Ct Maxillofacial Wo Cm  Result Date: 10/01/2018 CLINICAL DATA:  84-year-old female with head, neck and facial pain following fall. RIGHT facial hematoma. EXAM: CT HEAD WITHOUT CONTRAST CT MAXILLOFACIAL WITHOUT CONTRAST CT CERVICAL SPINE WITHOUT CONTRAST TECHNIQUE: Multidetector CT imaging of the head, cervical spine, and maxillofacial structures were performed using the standard protocol without intravenous contrast. Multiplanar CT image reconstructions of the cervical spine and maxillofacial structures were also generated. COMPARISON:  None. FINDINGS: CT HEAD FINDINGS Brain: No evidence of acute infarction, hemorrhage, hydrocephalus, extra-axial collection or mass lesion/mass effect. Atrophy and mild probable chronic small-vessel white matter ischemic changes noted. Vascular: Carotid atherosclerotic calcifications noted. Skull: No   acute calvarial abnormality identified. Other: RIGHT forehead soft tissue swelling identified. CT MAXILLOFACIAL FINDINGS Osseous: Fractures of the ANTERIOR, LATERAL and MEDIAL RIGHT maxillary sinus walls noted. There is 1.5 mm displacement of the ANTERIOR RIGHT maxillary sinus wall fracture. The other fractures are nondisplaced. No other acute fracture, subluxation or dislocation identified. Orbits: Negative. No traumatic or inflammatory finding. Sinuses: Fluid in the RIGHT maxillary and bilateral sphenoid sinuses noted. Soft tissues: RIGHT facial soft tissue swelling  identified. CT CERVICAL SPINE FINDINGS Alignment: Normal. Skull base and vertebrae: No acute fracture. No primary bone lesion or focal pathologic process. Soft tissues and spinal canal: No prevertebral fluid or swelling. No visible canal hematoma. Disc levels: Moderate to severe multilevel degenerative disc disease and facet arthropathy noted. Upper chest: No acute abnormality Other: None IMPRESSION: 1. No evidence of acute intracranial abnormality. Atrophy and probable mild chronic small-vessel white matter ischemic changes. 2. Fractures of the ANTERIOR, LATERAL and MEDIAL walls of the RIGHT maxillary sinus with overlying soft tissue swelling. 3. Small amount of fluid within the sphenoid sinuses without sphenoid or temporal bone fractures identified. 4. No static evidence of acute injury to the cervical spine. Moderate to severe multilevel degenerative changes. Electronically Signed   By: Jeffrey  Hu M.D.   On: 10/01/2018 11:18    Review of Systems  Constitutional: Negative for weight loss.  HENT: Negative for ear discharge, ear pain, hearing loss and tinnitus.   Eyes: Negative for blurred vision, double vision, photophobia and pain.  Respiratory: Negative for cough, sputum production and shortness of breath.   Cardiovascular: Negative for chest pain.  Gastrointestinal: Negative for abdominal pain, nausea and vomiting.  Genitourinary: Negative for dysuria, flank pain, frequency and urgency.  Musculoskeletal: Positive for joint pain (Right elbow). Negative for back pain, falls, myalgias and neck pain.  Neurological: Negative for dizziness, tingling, sensory change, focal weakness, loss of consciousness and headaches.  Endo/Heme/Allergies: Does not bruise/bleed easily.  Psychiatric/Behavioral: Negative for depression, memory loss and substance abuse. The patient is not nervous/anxious.    Blood pressure (!) 181/100, pulse 66, resp. rate 12, height 5' 7" (1.702 m), weight 59 kg, SpO2 100 %. Physical  Exam  Constitutional: She appears well-developed and well-nourished. No distress.  HENT:  Head: Normocephalic.  Eyes: Conjunctivae are normal. Right eye exhibits no discharge. Left eye exhibits no discharge. No scleral icterus.  Neck: Normal range of motion.  Cardiovascular: Normal rate and regular rhythm.  Respiratory: Effort normal. No respiratory distress.  Musculoskeletal:  Right shoulder, elbow, wrist, digits- no skin wounds, TTP elbow, no instability, no blocks to motion except pain  Sens  Ax/R/M/U intact  Mot   Ax/ R/ PIN/ M/ AIN/ U intact  Rad 2+  Neurological: She is alert.  Skin: Skin is warm and dry. She is not diaphoretic.  Psychiatric: She has a normal mood and affect. Her behavior is normal.    Assessment/Plan: Fall Right elbow fx -- Will need ORIF, plan tomorrow morning by Dr. Murphy. NPO after MN. Will place in splint overnight. Facial fxs -- Awaiting ENT consult Dementia -- Medicine to admit    Arnesia Vincelette J. Muhammad Vacca, PA-C Orthopedic Surgery [phone] 10/01/2018, 12:12 PM  

## 2018-10-01 NOTE — ED Notes (Signed)
Ortho tech called to place splint. 

## 2018-10-01 NOTE — Plan of Care (Signed)
  Problem: Activity: Goal: Risk for activity intolerance will decrease Outcome: Progressing   Problem: Coping: Goal: Level of anxiety will decrease Outcome: Progressing   Problem: Pain Managment: Goal: General experience of comfort will improve Outcome: Progressing   Problem: Safety: Goal: Ability to remain free from injury will improve Outcome: Progressing   Problem: Skin Integrity: Goal: Risk for impaired skin integrity will decrease Outcome: Progressing   

## 2018-10-01 NOTE — ED Provider Notes (Signed)
[facility] EMERGENCY DEPARTMENT Provider Note   CSN: [id number] Arrival date & time: 10/01/18  4098  History   Chief Complaint Chief Complaint  Patient presents with  . Fall  . Head Injury   HPI Mia Rogers is a 84 y.o. female with past medical history significant for dementia who presents evaluation after unwitnessed mechanical fall.  Patient is a resident of [employer] [facility].  Patient states he was going up a step and fell.  States patient was found down in the parking lot.  Patient denies loss of consciousness.  Denies use of anticoagulation.  Patient with complaints of pain to right eyebrow, right zygomatic, neck, right shoulder, right elbow, wrist and knee pain.  Rates her pain a 5/10.  Denies radiation of pain. Patient is able to supply some of her history. Denies alleviating or aggravating symptoms.  Denies dizziness, chest pain, lightheadedness prior to fall, weakness, slurred speech, chest pain, shortness of breath, fever, nausea, vomiting.  Family is not present with patient at initial evaluation.  Level 5 caveat due to dementia.  HPI  History reviewed. No pertinent past medical history.  There are no active problems to display for this patient.   History reviewed. No pertinent surgical history.   OB History   None      Home Medications    Prior to Admission medications   Medication Sig Start Date End Date Taking? Authorizing Provider  donepezil (ARICEPT) 10 MG tablet Take 10 mg by mouth at bedtime.   Yes [provider]  ibuprofen (ADVIL,MOTRIN) 200 MG tablet Take 400 mg by mouth every 8 (eight) hours as needed (pain).   Yes [provider]  levothyroxine (SYNTHROID, LEVOTHROID) 50 MCG tablet Take 50 mcg by mouth See admin instructions. Take one tablet (50 mcg) by mouth Sunday, Monday, Tuesday, Thursday, Friday Saturday at 6am (skip Wednesday)   Yes [provider]  Multiple Vitamin (DAILY VITE) TABS Take 1 tablet by  mouth daily.   Yes [provider]  propranolol (INDERAL) 40 MG tablet Take 40 mg by mouth daily at 6 (six) AM.   Yes [provider]  QUEtiapine (SEROQUEL) 25 MG tablet Take 50 mg by mouth at bedtime.    Yes [provider]    Family History No family history on file.  Social History Social History   Tobacco Use  . Smoking status: Never Smoker  . Smokeless tobacco: Never Used  Substance Use Topics  . Alcohol use: Never    Frequency: Never  . Drug use: Never     Allergies   Patient has no known allergies.   Review of Systems Review of Systems  Unable to perform ROS: Dementia     Physical Exam Updated Vital Signs BP (!) 181/100   Pulse 66   Resp 12   Ht 5\' 7"  (1.702 m)   Wt 59 kg   SpO2 100%   BMI 20.36 kg/m   Physical Exam  Constitutional:  Non-toxic appearance. She does not have a sickly appearance. She does not appear ill. No distress.  HENT:  Head: Head is with abrasion, with contusion and with laceration. Head is without raccoon's eyes, without Battle's sign, without right periorbital erythema and without left periorbital erythema. Hair is normal.  Right Ear: Tympanic membrane, external ear and ear canal normal. No drainage, swelling or tenderness. Tympanic membrane is not scarred, not perforated, not erythematous, not retracted and not bulging.  Left Ear: Tympanic membrane, external ear and ear canal  normal. No drainage, swelling or tenderness. Tympanic membrane is not scarred, not perforated, not erythematous, not retracted and not bulging.  Nose: Sinus tenderness present. No rhinorrhea, nose lacerations or nasal deformity. Right sinus exhibits maxillary sinus tenderness and frontal sinus tenderness. Left sinus exhibits no maxillary sinus tenderness and no frontal sinus tenderness.  Mouth/Throat: Uvula is midline, oropharynx is clear and moist and mucous membranes are normal. No oral lesions. No trismus in the jaw. Normal dentition. No  uvula swelling or lacerations. No oropharyngeal exudate, posterior oropharyngeal edema, posterior oropharyngeal erythema or tonsillar abscesses. No tonsillar exudate.  No evidence of oropharynx trauma.  No step-off to mandible or maxilla.  No fractured teeth.  Eyes: Pupils are equal, round, and reactive to light. Conjunctivae and EOM are normal. Right conjunctiva has no hemorrhage. Left conjunctiva has no hemorrhage.  EOM intact. No evidence of globe entraption. No evidence of traumatic hyphema.  Neck:  Mild tenderness to midline neck. C-Collar in place       ED Treatments / Results  Labs (all labs ordered are listed, but only abnormal results are displayed) Labs Reviewed  CBC WITH DIFFERENTIAL/PLATELET - Abnormal; Notable for the following components:      Result Value   Abs Immature Granulocytes 0.09 (*)    All other components within normal limits  BASIC METABOLIC PANEL - Abnormal; Notable for the following components:   Glucose, Bld 109 (*)    Creatinine, Ser 1.05 (*)    GFR calc non Af Amer 48 (*)    GFR calc Af Amer 55 (*)    All other components within normal limits    EKG None  Radiology Dg Ribs Unilateral W/chest Right  Result Date: 10/01/2018 CLINICAL DATA:  Recent fall with right-sided chest pain, initial encounter EXAM: RIGHT RIBS AND CHEST - 3+ VIEW COMPARISON:  05/30/2018 FINDINGS: Cardiac shadow is enlarged. Aortic calcifications are seen. No acute rib fracture is noted. Lung is well aerated. Stable compression deformities in the thoracic spine are seen. IMPRESSION: No acute rib fracture noted. Electronically Signed   By: Alcide Clever M.D.   On: 10/01/2018 10:53   Dg Shoulder Right  Result Date: 10/01/2018 CLINICAL DATA:  Recent fall today with right shoulder pain, initial encounter EXAM: RIGHT SHOULDER - 2+ VIEW COMPARISON:  None. FINDINGS: Degenerative changes of the acromioclavicular joint are seen. No acute fracture or dislocation is noted. The underlying bony  thorax appears within normal limits. No soft tissue changes are seen. IMPRESSION: Degenerative change without acute abnormality. Electronically Signed   By: Alcide Clever M.D.   On: 10/01/2018 10:55   Dg Elbow Complete Right  Result Date: 10/01/2018 CLINICAL DATA:  Recent fall with elbow pain, initial encounter EXAM: RIGHT ELBOW - COMPLETE 3+ VIEW COMPARISON:  None. FINDINGS: There is an olecranon fracture with distraction of the proximal fracture fragment by approximately 1 cm. Joint effusion is noted. No other fractures are seen. A small bony density is noted medially on the frontal films likely related to a fragment from the olecranon. IMPRESSION: Olecranon fracture with distraction. Electronically Signed   By: Alcide Clever M.D.   On: 10/01/2018 10:51   Ct Head Wo Contrast  Result Date: 10/01/2018 CLINICAL DATA:  84 year old female with head, neck and facial pain following fall. RIGHT facial hematoma. EXAM: CT HEAD WITHOUT CONTRAST CT MAXILLOFACIAL WITHOUT CONTRAST CT CERVICAL SPINE WITHOUT CONTRAST TECHNIQUE: Multidetector CT imaging of the head, cervical spine, and maxillofacial structures were performed using the standard protocol without intravenous contrast. Multiplanar  CT image reconstructions of the cervical spine and maxillofacial structures were also generated. COMPARISON:  None. FINDINGS: CT HEAD FINDINGS Brain: No evidence of acute infarction, hemorrhage, hydrocephalus, extra-axial collection or mass lesion/mass effect. Atrophy and mild probable chronic small-vessel white matter ischemic changes noted. Vascular: Carotid atherosclerotic calcifications noted. Skull: No acute calvarial abnormality identified. Other: RIGHT forehead soft tissue swelling identified. CT MAXILLOFACIAL FINDINGS Osseous: Fractures of the ANTERIOR, LATERAL and MEDIAL RIGHT maxillary sinus walls noted. There is 1.5 mm displacement of the ANTERIOR RIGHT maxillary sinus wall fracture. The other fractures are nondisplaced. No  other acute fracture, subluxation or dislocation identified. Orbits: Negative. No traumatic or inflammatory finding. Sinuses: Fluid in the RIGHT maxillary and bilateral sphenoid sinuses noted. Soft tissues: RIGHT facial soft tissue swelling identified. CT CERVICAL SPINE FINDINGS Alignment: Normal. Skull base and vertebrae: No acute fracture. No primary bone lesion or focal pathologic process. Soft tissues and spinal canal: No prevertebral fluid or swelling. No visible canal hematoma. Disc levels: Moderate to severe multilevel degenerative disc disease and facet arthropathy noted. Upper chest: No acute abnormality Other: None IMPRESSION: 1. No evidence of acute intracranial abnormality. Atrophy and probable mild chronic small-vessel white matter ischemic changes. 2. Fractures of the ANTERIOR, LATERAL and MEDIAL walls of the RIGHT maxillary sinus with overlying soft tissue swelling. 3. Small amount of fluid within the sphenoid sinuses without sphenoid or temporal bone fractures identified. 4. No static evidence of acute injury to the cervical spine. Moderate to severe multilevel degenerative changes. Electronically Signed   By: Harmon Pier M.D.   On: 10/01/2018 11:18   Ct Cervical Spine Wo Contrast  Result Date: 10/01/2018 CLINICAL DATA:  84 year old female with head, neck and facial pain following fall. RIGHT facial hematoma. EXAM: CT HEAD WITHOUT CONTRAST CT MAXILLOFACIAL WITHOUT CONTRAST CT CERVICAL SPINE WITHOUT CONTRAST TECHNIQUE: Multidetector CT imaging of the head, cervical spine, and maxillofacial structures were performed using the standard protocol without intravenous contrast. Multiplanar CT image reconstructions of the cervical spine and maxillofacial structures were also generated. COMPARISON:  None. FINDINGS: CT HEAD FINDINGS Brain: No evidence of acute infarction, hemorrhage, hydrocephalus, extra-axial collection or mass lesion/mass effect. Atrophy and mild probable chronic small-vessel white  matter ischemic changes noted. Vascular: Carotid atherosclerotic calcifications noted. Skull: No acute calvarial abnormality identified. Other: RIGHT forehead soft tissue swelling identified. CT MAXILLOFACIAL FINDINGS Osseous: Fractures of the ANTERIOR, LATERAL and MEDIAL RIGHT maxillary sinus walls noted. There is 1.5 mm displacement of the ANTERIOR RIGHT maxillary sinus wall fracture. The other fractures are nondisplaced. No other acute fracture, subluxation or dislocation identified. Orbits: Negative. No traumatic or inflammatory finding. Sinuses: Fluid in the RIGHT maxillary and bilateral sphenoid sinuses noted. Soft tissues: RIGHT facial soft tissue swelling identified. CT CERVICAL SPINE FINDINGS Alignment: Normal. Skull base and vertebrae: No acute fracture. No primary bone lesion or focal pathologic process. Soft tissues and spinal canal: No prevertebral fluid or swelling. No visible canal hematoma. Disc levels: Moderate to severe multilevel degenerative disc disease and facet arthropathy noted. Upper chest: No acute abnormality Other: None IMPRESSION: 1. No evidence of acute intracranial abnormality. Atrophy and probable mild chronic small-vessel white matter ischemic changes. 2. Fractures of the ANTERIOR, LATERAL and MEDIAL walls of the RIGHT maxillary sinus with overlying soft tissue swelling. 3. Small amount of fluid within the sphenoid sinuses without sphenoid or temporal bone fractures identified. 4. No static evidence of acute injury to the cervical spine. Moderate to severe multilevel degenerative changes. Electronically Signed   By: Henrietta Hoover.D.  On: 10/01/2018 11:18   Dg Knee Complete 4 Views Right  Result Date: 10/01/2018 CLINICAL DATA:  Right knee pain following fall today, initial encounter EXAM: RIGHT KNEE - COMPLETE 4+ VIEW COMPARISON:  None. FINDINGS: No evidence of fracture, dislocation, or joint effusion. No evidence of arthropathy or other focal bone abnormality. Soft tissues are  unremarkable. IMPRESSION: No acute fracture noted. Electronically Signed   By: Alcide CleverMark  Lukens M.D.   On: 10/01/2018 10:54   Dg Hand Complete Right  Result Date: 10/01/2018 CLINICAL DATA:  Recent fall with right hand pain, initial encounter EXAM: RIGHT HAND - COMPLETE 3+ VIEW COMPARISON:  None. FINDINGS: Mild degenerative changes are noted most prominent at the third MCP joint as well as in the radiocarpal articulation. No fracture or dislocation is seen. No gross soft tissue abnormality is noted. IMPRESSION: Degenerative changes without acute abnormality. Electronically Signed   By: Alcide CleverMark  Lukens M.D.   On: 10/01/2018 10:53   Dg Hip Unilat W Or Wo Pelvis 2-3 Views Right  Result Date: 10/01/2018 CLINICAL DATA:  Recent fall with right hip pain, initial encounter EXAM: DG HIP (WITH OR WITHOUT PELVIS) 2-3V RIGHT COMPARISON:  None. FINDINGS: There are changes consistent with prior superior and inferior pubic rami fractures with healing bilaterally. No acute fracture or dislocation is seen. No soft tissue abnormality is noted. IMPRESSION: Old fractures of the pubic rami bilaterally. No acute abnormality noted. Electronically Signed   By: Alcide CleverMark  Lukens M.D.   On: 10/01/2018 10:54   Ct Maxillofacial Wo Cm  Result Date: 10/01/2018 CLINICAL DATA:  84 year old female with head, neck and facial pain following fall. RIGHT facial hematoma. EXAM: CT HEAD WITHOUT CONTRAST CT MAXILLOFACIAL WITHOUT CONTRAST CT CERVICAL SPINE WITHOUT CONTRAST TECHNIQUE: Multidetector CT imaging of the head, cervical spine, and maxillofacial structures were performed using the standard protocol without intravenous contrast. Multiplanar CT image reconstructions of the cervical spine and maxillofacial structures were also generated. COMPARISON:  None. FINDINGS: CT HEAD FINDINGS Brain: No evidence of acute infarction, hemorrhage, hydrocephalus, extra-axial collection or mass lesion/mass effect. Atrophy and mild probable chronic small-vessel white  matter ischemic changes noted. Vascular: Carotid atherosclerotic calcifications noted. Skull: No acute calvarial abnormality identified. Other: RIGHT forehead soft tissue swelling identified. CT MAXILLOFACIAL FINDINGS Osseous: Fractures of the ANTERIOR, LATERAL and MEDIAL RIGHT maxillary sinus walls noted. There is 1.5 mm displacement of the ANTERIOR RIGHT maxillary sinus wall fracture. The other fractures are nondisplaced. No other acute fracture, subluxation or dislocation identified. Orbits: Negative. No traumatic or inflammatory finding. Sinuses: Fluid in the RIGHT maxillary and bilateral sphenoid sinuses noted. Soft tissues: RIGHT facial soft tissue swelling identified. CT CERVICAL SPINE FINDINGS Alignment: Normal. Skull base and vertebrae: No acute fracture. No primary bone lesion or focal pathologic process. Soft tissues and spinal canal: No prevertebral fluid or swelling. No visible canal hematoma. Disc levels: Moderate to severe multilevel degenerative disc disease and facet arthropathy noted. Upper chest: No acute abnormality Other: None IMPRESSION: 1. No evidence of acute intracranial abnormality. Atrophy and probable mild chronic small-vessel white matter ischemic changes. 2. Fractures of the ANTERIOR, LATERAL and MEDIAL walls of the RIGHT maxillary sinus with overlying soft tissue swelling. 3. Small amount of fluid within the sphenoid sinuses without sphenoid or temporal bone fractures identified. 4. No static evidence of acute injury to the cervical spine. Moderate to severe multilevel degenerative changes. Electronically Signed   By: Harmon PierJeffrey  Hu M.D.   On: 10/01/2018 11:18    Procedures .Marland Kitchen.Laceration Repair Date/Time: 10/01/2018 1:27 PM Performed  by: Linwood Dibbles, PA-C Authorized by: Linwood Dibbles, PA-C   Consent:    Consent obtained:  Verbal   Consent given by:  Patient   Risks discussed:  Infection, need for additional repair, pain, poor cosmetic result and poor wound healing    Alternatives discussed:  No treatment and delayed treatment Universal protocol:    Procedure explained and questions answered to patient or proxy's satisfaction: yes     Relevant documents present and verified: yes     Test results available and properly labeled: yes     Imaging studies available: yes     Required blood products, implants, devices, and special equipment available: yes     Site/side marked: yes     Immediately prior to procedure, a time out was called: yes     Patient identity confirmed:  Verbally with patient Anesthesia (see MAR for exact dosages):    Anesthesia method:  Local infiltration   Local anesthetic:  Lidocaine 1% w/o epi Laceration details:    Location:  Face   Face location:  R eyebrow   Length (cm):  1.5 Repair type:    Repair type:  Simple Pre-procedure details:    Preparation:  Patient was prepped and draped in usual sterile fashion and imaging obtained to evaluate for foreign bodies Exploration:    Hemostasis achieved with:  Direct pressure   Wound exploration: wound explored through full range of motion and entire depth of wound probed and visualized     Wound extent: no foreign bodies/material noted, no muscle damage noted, no nerve damage noted, no tendon damage noted, no underlying fracture noted and no vascular damage noted   Treatment:    Area cleansed with:  Betadine   Amount of cleaning:  Standard   Irrigation solution:  Sterile saline Skin repair:    Repair method:  Sutures   Suture size:  5-0   Suture material:  Prolene   Number of sutures:  3 Approximation:    Approximation:  Close Post-procedure details:    Dressing:  Open (no dressing)   Patient tolerance of procedure:  Tolerated well, no immediate complications   (including critical care time)  Medications Ordered in ED Medications  lidocaine (PF) (XYLOCAINE) 1 % injection 5 mL (has no administration in time range)  Tdap (BOOSTRIX) injection 0.5 mL (has no administration in  time range)  morphine 4 MG/ML injection 4 mg (4 mg Intravenous Given 10/01/18 1142)     Initial Impression / Assessment and Plan / ED Course  I have reviewed the triage vital signs and the nursing notes.  Pertinent labs & imaging results that were available during my care of the patient were reviewed by me and considered in my medical decision making (see chart for details).  84 year old female who appears otherwise well presents for evaluation after mechanical fall.  She was out in the parking lot she tripped over the curb.  Fall was unwitnessed from facility, [facility] per EMS.  Patient states she hit her face as well as has pain to her right upper and lower extremity.  She is able to move all extremities, however with pain.  Neurovascularly intact.  He does have a laceration above the right eyebrow.  Afebrile, nonseptic, non-ill-appearing.  Will obtain imaging and screening labs and reevaluate.  Left elbow with olecranon fracture with distraction.  CT max/face with anterior, lateral, medial medial maxillary right fractures. CT head and cervical spine negative for acute pathology. Additional imaging negative. Moves all  extremities without difficulty or ataxia. Patient requesting medicine for pain at this time.  Will plan on consulting orthopedics and ENT for recommendations.  Family in room on reevaluation.  States patient is mobile at baseline and walks her dog daily.  She does have mild dementia, however it is very mild in nature.  Patient is at baseline mentality per family.  Patient with decrease in pain with medications.  CBC without leukocytosis.  Metabolic panel with mild elevation in glucose at 109, creatinine 1.05.  Has had mild elevation in blood pressure.  Does not have history of hypertension.  Most likely due to pain.  Will reevaluate after pain medication given.  1145: Jeffries PA-C with orthopedics will evaluate for olecranon fracture.  1215: Orthopedics to take patient to  OR for fracture tomorrow morning. Will need medicine to admit. Will plan on consulting with medicine after ENT consult.  1238: Dr. Jenne Pane with ENT will consult for facial fractures. Laceration repaired without difficulty. See procedure note. #3 Prolene sutures placed. Patient is hemodynamically stable and in no acute distress.  1400: Consulted with Dr.Olson with Family medicine teaching service for admission.   Patient has been seen and evaluated by my attending Dr. Criss Alvine who agrees with above treatment, plan and disposition.    Final Clinical Impressions(s) / ED Diagnoses   Final diagnoses:  Fall, initial encounter    ED Discharge Orders    None       Kaity Pitstick A, PA-C 10/01/18 1703    Pricilla Loveless, MD 10/05/18 1450

## 2018-10-01 NOTE — Consult Note (Signed)
Reason for Consult: Facial fracture Referring Physician: ER  Mia Rogers is an 84 y.o. female.  HPI: 84 year old female with dementia fell earlier today sustaining injuries to the face and right upper extremity.  Mia Rogers complains of pain in these areas but has no other complaints.  History reviewed. No pertinent past medical history.  History reviewed. No pertinent surgical history.  No family history on file.  Social History:  reports that Mia Rogers has never smoked. Mia Rogers has never used smokeless tobacco. Mia Rogers reports that Mia Rogers does not drink alcohol or use drugs.  Allergies: No Known Allergies  Medications: I have reviewed the patient's current medications.  Results for orders placed or performed during the hospital encounter of 10/01/18 (from the past 48 hour(s))  CBC with Differential     Status: Abnormal   Collection Time: 10/01/18  9:50 AM  Result Value Ref Range   WBC 7.5 4.0 - 10.5 K/uL   RBC 4.46 3.87 - 5.11 MIL/uL   Hemoglobin 12.7 12.0 - 15.0 g/dL   HCT 16.1 09.6 - 04.5 %   MCV 91.7 80.0 - 100.0 fL   MCH 28.5 26.0 - 34.0 pg   MCHC 31.1 30.0 - 36.0 g/dL   RDW 40.9 81.1 - 91.4 %   Platelets 296 150 - 400 K/uL   nRBC 0.0 0.0 - 0.2 %   Neutrophils Relative % 73 %   Neutro Abs 5.5 1.7 - 7.7 K/uL   Lymphocytes Relative 15 %   Lymphs Abs 1.1 0.7 - 4.0 K/uL   Monocytes Relative 7 %   Monocytes Absolute 0.5 0.1 - 1.0 K/uL   Eosinophils Relative 3 %   Eosinophils Absolute 0.2 0.0 - 0.5 K/uL   Basophils Relative 1 %   Basophils Absolute 0.1 0.0 - 0.1 K/uL   Immature Granulocytes 1 %   Abs Immature Granulocytes 0.09 (H) 0.00 - 0.07 K/uL    Comment: Performed at [facility], [address], [place], [place] [address]  Basic metabolic panel     Status: Abnormal   Collection Time: 10/01/18  9:50 AM  Result Value Ref Range   Sodium 140 135 - 145 mmol/L   Potassium 3.9 3.5 - 5.1 mmol/L   Chloride 106 98 - 111 mmol/L   CO2 24 22 - 32 mmol/L   Glucose, Bld 109 (H) 70 - 99  mg/dL   BUN 9 8 - 23 mg/dL   Creatinine, Ser 6.21 (H) 0.44 - 1.00 mg/dL   Calcium 9.3 8.9 - 30.8 mg/dL   GFR calc non Af Amer 48 (L) >60 mL/min   GFR calc Af Amer 55 (L) >60 mL/min   Anion gap 10 5 - 15    Comment: Performed at [facility], [address] [place], [place] [address]    Dg Ribs Unilateral W/chest Right  Result Date: 10/01/2018 CLINICAL DATA:  Recent fall with right-sided chest pain, initial encounter EXAM: RIGHT RIBS AND CHEST - 3+ VIEW COMPARISON:  05/30/2018 FINDINGS: Cardiac shadow is enlarged. Aortic calcifications are seen. No acute rib fracture is noted. Lung is well aerated. Stable compression deformities in the thoracic spine are seen. IMPRESSION: No acute rib fracture noted. Electronically Signed   By: Alcide Clever M.D.   On: 10/01/2018 10:53   Dg Shoulder Right  Result Date: 10/01/2018 CLINICAL DATA:  Recent fall today with right shoulder pain, initial encounter EXAM: RIGHT SHOULDER - 2+ VIEW COMPARISON:  None. FINDINGS: Degenerative changes of the acromioclavicular joint are  seen. No acute fracture or dislocation is noted. The underlying bony thorax appears within normal limits. No soft tissue changes are seen. IMPRESSION: Degenerative change without acute abnormality. Electronically Signed   By: Alcide Clever M.D.   On: 10/01/2018 10:55   Dg Elbow Complete Right  Result Date: 10/01/2018 CLINICAL DATA:  Recent fall with elbow pain, initial encounter EXAM: RIGHT ELBOW - COMPLETE 3+ VIEW COMPARISON:  None. FINDINGS: There is an olecranon fracture with distraction of the proximal fracture fragment by approximately 1 cm. Joint effusion is noted. No other fractures are seen. A small bony density is noted medially on the frontal films likely related to a fragment from the olecranon. IMPRESSION: Olecranon fracture with distraction. Electronically Signed   By: Alcide Clever M.D.   On: 10/01/2018 10:51   Ct Head Wo Contrast  Result Date: 10/01/2018 CLINICAL DATA:   84 year old female with head, neck and facial pain following fall. RIGHT facial hematoma. EXAM: CT HEAD WITHOUT CONTRAST CT MAXILLOFACIAL WITHOUT CONTRAST CT CERVICAL SPINE WITHOUT CONTRAST TECHNIQUE: Multidetector CT imaging of the head, cervical spine, and maxillofacial structures were performed using the standard protocol without intravenous contrast. Multiplanar CT image reconstructions of the cervical spine and maxillofacial structures were also generated. COMPARISON:  None. FINDINGS: CT HEAD FINDINGS Brain: No evidence of acute infarction, hemorrhage, hydrocephalus, extra-axial collection or mass lesion/mass effect. Atrophy and mild probable chronic small-vessel white matter ischemic changes noted. Vascular: Carotid atherosclerotic calcifications noted. Skull: No acute calvarial abnormality identified. Other: RIGHT forehead soft tissue swelling identified. CT MAXILLOFACIAL FINDINGS Osseous: Fractures of the ANTERIOR, LATERAL and MEDIAL RIGHT maxillary sinus walls noted. There is 1.5 mm displacement of the ANTERIOR RIGHT maxillary sinus wall fracture. The other fractures are nondisplaced. No other acute fracture, subluxation or dislocation identified. Orbits: Negative. No traumatic or inflammatory finding. Sinuses: Fluid in the RIGHT maxillary and bilateral sphenoid sinuses noted. Soft tissues: RIGHT facial soft tissue swelling identified. CT CERVICAL SPINE FINDINGS Alignment: Normal. Skull base and vertebrae: No acute fracture. No primary bone lesion or focal pathologic process. Soft tissues and spinal canal: No prevertebral fluid or swelling. No visible canal hematoma. Disc levels: Moderate to severe multilevel degenerative disc disease and facet arthropathy noted. Upper chest: No acute abnormality Other: None IMPRESSION: 1. No evidence of acute intracranial abnormality. Atrophy and probable mild chronic small-vessel white matter ischemic changes. 2. Fractures of the ANTERIOR, LATERAL and MEDIAL walls of the  RIGHT maxillary sinus with overlying soft tissue swelling. 3. Small amount of fluid within the sphenoid sinuses without sphenoid or temporal bone fractures identified. 4. No static evidence of acute injury to the cervical spine. Moderate to severe multilevel degenerative changes. Electronically Signed   By: Harmon Pier M.D.   On: 10/01/2018 11:18   Ct Cervical Spine Wo Contrast  Result Date: 10/01/2018 CLINICAL DATA:  84 year old female with head, neck and facial pain following fall. RIGHT facial hematoma. EXAM: CT HEAD WITHOUT CONTRAST CT MAXILLOFACIAL WITHOUT CONTRAST CT CERVICAL SPINE WITHOUT CONTRAST TECHNIQUE: Multidetector CT imaging of the head, cervical spine, and maxillofacial structures were performed using the standard protocol without intravenous contrast. Multiplanar CT image reconstructions of the cervical spine and maxillofacial structures were also generated. COMPARISON:  None. FINDINGS: CT HEAD FINDINGS Brain: No evidence of acute infarction, hemorrhage, hydrocephalus, extra-axial collection or mass lesion/mass effect. Atrophy and mild probable chronic small-vessel white matter ischemic changes noted. Vascular: Carotid atherosclerotic calcifications noted. Skull: No acute calvarial abnormality identified. Other: RIGHT forehead soft tissue swelling identified. CT MAXILLOFACIAL FINDINGS Osseous:  Fractures of the ANTERIOR, LATERAL and MEDIAL RIGHT maxillary sinus walls noted. There is 1.5 mm displacement of the ANTERIOR RIGHT maxillary sinus wall fracture. The other fractures are nondisplaced. No other acute fracture, subluxation or dislocation identified. Orbits: Negative. No traumatic or inflammatory finding. Sinuses: Fluid in the RIGHT maxillary and bilateral sphenoid sinuses noted. Soft tissues: RIGHT facial soft tissue swelling identified. CT CERVICAL SPINE FINDINGS Alignment: Normal. Skull base and vertebrae: No acute fracture. No primary bone lesion or focal pathologic process. Soft tissues  and spinal canal: No prevertebral fluid or swelling. No visible canal hematoma. Disc levels: Moderate to severe multilevel degenerative disc disease and facet arthropathy noted. Upper chest: No acute abnormality Other: None IMPRESSION: 1. No evidence of acute intracranial abnormality. Atrophy and probable mild chronic small-vessel white matter ischemic changes. 2. Fractures of the ANTERIOR, LATERAL and MEDIAL walls of the RIGHT maxillary sinus with overlying soft tissue swelling. 3. Small amount of fluid within the sphenoid sinuses without sphenoid or temporal bone fractures identified. 4. No static evidence of acute injury to the cervical spine. Moderate to severe multilevel degenerative changes. Electronically Signed   By: Harmon PierJeffrey  Hu M.D.   On: 10/01/2018 11:18   Dg Knee Complete 4 Views Right  Result Date: 10/01/2018 CLINICAL DATA:  Right knee pain following fall today, initial encounter EXAM: RIGHT KNEE - COMPLETE 4+ VIEW COMPARISON:  None. FINDINGS: No evidence of fracture, dislocation, or joint effusion. No evidence of arthropathy or other focal bone abnormality. Soft tissues are unremarkable. IMPRESSION: No acute fracture noted. Electronically Signed   By: Alcide CleverMark  Lukens M.D.   On: 10/01/2018 10:54   Dg Hand Complete Right  Result Date: 10/01/2018 CLINICAL DATA:  Recent fall with right hand pain, initial encounter EXAM: RIGHT HAND - COMPLETE 3+ VIEW COMPARISON:  None. FINDINGS: Mild degenerative changes are noted most prominent at the third MCP joint as well as in the radiocarpal articulation. No fracture or dislocation is seen. No gross soft tissue abnormality is noted. IMPRESSION: Degenerative changes without acute abnormality. Electronically Signed   By: Alcide CleverMark  Lukens M.D.   On: 10/01/2018 10:53   Dg Hip Unilat W Or Wo Pelvis 2-3 Views Right  Result Date: 10/01/2018 CLINICAL DATA:  Recent fall with right hip pain, initial encounter EXAM: DG HIP (WITH OR WITHOUT PELVIS) 2-3V RIGHT COMPARISON:   None. FINDINGS: There are changes consistent with prior superior and inferior pubic rami fractures with healing bilaterally. No acute fracture or dislocation is seen. No soft tissue abnormality is noted. IMPRESSION: Old fractures of the pubic rami bilaterally. No acute abnormality noted. Electronically Signed   By: Alcide CleverMark  Lukens M.D.   On: 10/01/2018 10:54   Ct Maxillofacial Wo Cm  Result Date: 10/01/2018 CLINICAL DATA:  84 year old female with head, neck and facial pain following fall. RIGHT facial hematoma. EXAM: CT HEAD WITHOUT CONTRAST CT MAXILLOFACIAL WITHOUT CONTRAST CT CERVICAL SPINE WITHOUT CONTRAST TECHNIQUE: Multidetector CT imaging of the head, cervical spine, and maxillofacial structures were performed using the standard protocol without intravenous contrast. Multiplanar CT image reconstructions of the cervical spine and maxillofacial structures were also generated. COMPARISON:  None. FINDINGS: CT HEAD FINDINGS Brain: No evidence of acute infarction, hemorrhage, hydrocephalus, extra-axial collection or mass lesion/mass effect. Atrophy and mild probable chronic small-vessel white matter ischemic changes noted. Vascular: Carotid atherosclerotic calcifications noted. Skull: No acute calvarial abnormality identified. Other: RIGHT forehead soft tissue swelling identified. CT MAXILLOFACIAL FINDINGS Osseous: Fractures of the ANTERIOR, LATERAL and MEDIAL RIGHT maxillary sinus walls noted. There is  1.5 mm displacement of the ANTERIOR RIGHT maxillary sinus wall fracture. The other fractures are nondisplaced. No other acute fracture, subluxation or dislocation identified. Orbits: Negative. No traumatic or inflammatory finding. Sinuses: Fluid in the RIGHT maxillary and bilateral sphenoid sinuses noted. Soft tissues: RIGHT facial soft tissue swelling identified. CT CERVICAL SPINE FINDINGS Alignment: Normal. Skull base and vertebrae: No acute fracture. No primary bone lesion or focal pathologic process. Soft  tissues and spinal canal: No prevertebral fluid or swelling. No visible canal hematoma. Disc levels: Moderate to severe multilevel degenerative disc disease and facet arthropathy noted. Upper chest: No acute abnormality Other: None IMPRESSION: 1. No evidence of acute intracranial abnormality. Atrophy and probable mild chronic small-vessel white matter ischemic changes. 2. Fractures of the ANTERIOR, LATERAL and MEDIAL walls of the RIGHT maxillary sinus with overlying soft tissue swelling. 3. Small amount of fluid within the sphenoid sinuses without sphenoid or temporal bone fractures identified. 4. No static evidence of acute injury to the cervical spine. Moderate to severe multilevel degenerative changes. Electronically Signed   By: Harmon Pier M.D.   On: 10/01/2018 11:18    Review of Systems  Musculoskeletal: Positive for joint pain and neck pain.  Neurological: Positive for headaches.  All other systems reviewed and are negative.  Blood pressure (!) 181/100, pulse 66, resp. rate 12, height 5\' 7"  (1.702 m), weight 59 kg, SpO2 100 %. Physical Exam  Constitutional: Mia Rogers appears well-developed and well-nourished. No distress.  HENT:  Right Ear: External ear normal.  Left Ear: External ear normal.  Nose: Nose normal.  Mouth/Throat: Oropharynx is clear and moist.  Right periorbital edema and ecchymosis.  Sutured right brow laceration.  Right maxillary tenderness.  No midface or orbital rim deformity.  Normal occlusion.  Eyes: Pupils are equal, round, and reactive to light. Conjunctivae and EOM are normal.  Neck: Normal range of motion. Neck supple.  Cardiovascular: Normal rate.  Respiratory: Effort normal.  Neurological: Mia Rogers is alert. No cranial nerve deficit.  Skin: Skin is warm and dry.  Psychiatric: Mia Rogers has a normal mood and affect. Her behavior is normal.    Assessment/Plan: Right non-displaced orbitozygomatic complex fracture  I personally reviewed her maxillofacial CT and examined the  patient.  The right midface fracture is not significantly displaced and will heal without intervention.  Mia Rogers does not need a follow-up appointment.  Jeremih Dearmas 10/01/2018, 2:48 PM

## 2018-10-01 NOTE — Progress Notes (Signed)
Orthopedic Tech Progress Note Patient Details:  Mia Rogers 10-07-1928 [id number]  Ortho Devices Type of Ortho Device: Arm sling, Long arm splint Ortho Device/Splint Location: long arm splint Ortho Device/Splint Interventions: Ordered, Application   Post Interventions Patient Tolerated: Well Instructions Provided: Care of device, Adjustment of device   Mia Rogers 10/01/2018, 6:09 PM

## 2018-10-02 ENCOUNTER — Inpatient Hospital Stay (HOSPITAL_COMMUNITY): Payer: Medicare Other | Admitting: Anesthesiology

## 2018-10-02 ENCOUNTER — Other Ambulatory Visit: Payer: Self-pay

## 2018-10-02 ENCOUNTER — Encounter (HOSPITAL_COMMUNITY)
Admission: EM | Disposition: A | Discharge status: Skilled Nursing Facility | Payer: Self-pay | Source: Skilled Nursing Facility | Attending: Family Medicine

## 2018-10-02 ENCOUNTER — Encounter (HOSPITAL_COMMUNITY): Payer: Self-pay | Admitting: Surgery

## 2018-10-02 HISTORY — PX: ORIF ELBOW FRACTURE: SHX5031

## 2018-10-02 LAB — SURGICAL PCR SCREEN
MRSA, PCR: NEGATIVE
Staphylococcus aureus: NEGATIVE

## 2018-10-02 LAB — CBC
HCT: 36.5 % (ref 36.0–46.0)
HEMOGLOBIN: 11.5 g/dL — AB (ref 12.0–15.0)
MCH: 28.4 pg (ref 26.0–34.0)
MCHC: 31.5 g/dL (ref 30.0–36.0)
MCV: 90.1 fL (ref 80.0–100.0)
Platelets: 259 10*3/uL (ref 150–400)
RBC: 4.05 MIL/uL (ref 3.87–5.11)
RDW: 14.3 % (ref 11.5–15.5)
WBC: 6.7 10*3/uL (ref 4.0–10.5)
nRBC: 0 % (ref 0.0–0.2)

## 2018-10-02 LAB — BASIC METABOLIC PANEL
Anion gap: 11 (ref 5–15)
BUN: 18 mg/dL (ref 8–23)
CO2: 24 mmol/L (ref 22–32)
Calcium: 8.6 mg/dL — ABNORMAL LOW (ref 8.9–10.3)
Chloride: 102 mmol/L (ref 98–111)
Creatinine, Ser: 1.35 mg/dL — ABNORMAL HIGH (ref 0.44–1.00)
GFR calc Af Amer: 41 mL/min — ABNORMAL LOW (ref >60)
GFR calc non Af Amer: 35 mL/min — ABNORMAL LOW (ref >60)
Glucose, Bld: 112 mg/dL — ABNORMAL HIGH (ref 70–99)
Potassium: 3.6 mmol/L (ref 3.5–5.1)
Sodium: 137 mmol/L (ref 135–145)

## 2018-10-02 LAB — PROTIME-INR
INR: 1.08
Prothrombin Time: 13.9 seconds (ref 11.4–15.2)

## 2018-10-02 LAB — APTT: aPTT: 28 seconds (ref 24–36)

## 2018-10-02 SURGERY — OPEN REDUCTION INTERNAL FIXATION (ORIF) ELBOW/OLECRANON FRACTURE
Anesthesia: General | Laterality: Right

## 2018-10-02 MED ORDER — METOCLOPRAMIDE HCL 5 MG PO TABS
5.0000 mg | ORAL_TABLET | Freq: Three times a day (TID) | ORAL | Status: DC | PRN
  Administered 2018-10-05: 5 mg via ORAL
  Filled 2018-10-02: qty 1

## 2018-10-02 MED ORDER — FENTANYL CITRATE (PF) 250 MCG/5ML IJ SOLN
INTRAMUSCULAR | Status: AC
  Filled 2018-10-02: qty 5

## 2018-10-02 MED ORDER — PROPOFOL 10 MG/ML IV BOLUS
INTRAVENOUS | Status: DC | PRN
  Administered 2018-10-02: 90 mg via INTRAVENOUS

## 2018-10-02 MED ORDER — METHOCARBAMOL 500 MG PO TABS
500.0000 mg | ORAL_TABLET | Freq: Four times a day (QID) | ORAL | Status: DC | PRN
  Administered 2018-10-03: 500 mg via ORAL
  Filled 2018-10-02 (×2): qty 1

## 2018-10-02 MED ORDER — HYDROCODONE-ACETAMINOPHEN 5-325 MG PO TABS
1.0000 | ORAL_TABLET | Freq: Four times a day (QID) | ORAL | Status: DC | PRN
  Administered 2018-10-03: 1 via ORAL
  Filled 2018-10-02: qty 1

## 2018-10-02 MED ORDER — PHENYLEPHRINE 40 MCG/ML (10ML) SYRINGE FOR IV PUSH (FOR BLOOD PRESSURE SUPPORT)
PREFILLED_SYRINGE | INTRAVENOUS | Status: AC
  Filled 2018-10-02: qty 10

## 2018-10-02 MED ORDER — LACTATED RINGERS IV SOLN: INTRAVENOUS | Status: DC

## 2018-10-02 MED ORDER — CEFAZOLIN SODIUM-DEXTROSE 1-4 GM/50ML-% IV SOLN
1.0000 g | Freq: Once | INTRAVENOUS | Status: AC
  Administered 2018-10-02: 1 g via INTRAVENOUS
  Filled 2018-10-02: qty 50

## 2018-10-02 MED ORDER — LIDOCAINE 2% (20 MG/ML) 5 ML SYRINGE
INTRAMUSCULAR | Status: AC
  Filled 2018-10-02: qty 5

## 2018-10-02 MED ORDER — SODIUM CHLORIDE 0.9 % IV SOLN: INTRAVENOUS | Status: AC

## 2018-10-02 MED ORDER — WHITE PETROLATUM EX OINT
TOPICAL_OINTMENT | CUTANEOUS | Status: AC
  Administered 2018-10-02: 07:00:00
  Filled 2018-10-02: qty 28.35

## 2018-10-02 MED ORDER — PHENYLEPHRINE HCL 10 MG/ML IJ SOLN
INTRAMUSCULAR | Status: DC | PRN
  Administered 2018-10-02: 80 ug via INTRAVENOUS
  Administered 2018-10-02: 40 ug via INTRAVENOUS
  Administered 2018-10-02: 160 ug via INTRAVENOUS
  Administered 2018-10-02: 40 ug via INTRAVENOUS
  Administered 2018-10-02: 80 ug via INTRAVENOUS

## 2018-10-02 MED ORDER — EPHEDRINE SULFATE 50 MG/ML IJ SOLN
INTRAMUSCULAR | Status: DC | PRN
  Administered 2018-10-02: 20 mg via INTRAVENOUS

## 2018-10-02 MED ORDER — OXYCODONE HCL 5 MG/5ML PO SOLN: 5.0000 mg | Freq: Once | ORAL | Status: DC | PRN

## 2018-10-02 MED ORDER — ROCURONIUM BROMIDE 50 MG/5ML IV SOSY
PREFILLED_SYRINGE | INTRAVENOUS | Status: AC
  Filled 2018-10-02: qty 5

## 2018-10-02 MED ORDER — OXYCODONE HCL 5 MG PO TABS: 5.0000 mg | ORAL_TABLET | Freq: Once | ORAL | Status: DC | PRN

## 2018-10-02 MED ORDER — ROPIVACAINE HCL 5 MG/ML IJ SOLN
INTRAMUSCULAR | Status: DC | PRN
  Administered 2018-10-02: 25 mL via PERINEURAL

## 2018-10-02 MED ORDER — SODIUM CHLORIDE 0.9 % IV SOLN
INTRAVENOUS | Status: DC | PRN
  Administered 2018-10-02: 30 ug/min via INTRAVENOUS

## 2018-10-02 MED ORDER — ACETAMINOPHEN 325 MG PO TABS: 325.0000 mg | ORAL_TABLET | Freq: Four times a day (QID) | ORAL | Status: DC | PRN

## 2018-10-02 MED ORDER — ONDANSETRON HCL 4 MG PO TABS: 4.0000 mg | ORAL_TABLET | Freq: Four times a day (QID) | ORAL | Status: DC | PRN

## 2018-10-02 MED ORDER — ONDANSETRON HCL 4 MG/2ML IJ SOLN
INTRAMUSCULAR | Status: DC | PRN
  Administered 2018-10-02: 4 mg via INTRAVENOUS

## 2018-10-02 MED ORDER — ONDANSETRON HCL 4 MG/2ML IJ SOLN: 4.0000 mg | Freq: Once | INTRAMUSCULAR | Status: DC | PRN

## 2018-10-02 MED ORDER — DEXAMETHASONE SODIUM PHOSPHATE 10 MG/ML IJ SOLN
INTRAMUSCULAR | Status: AC
  Filled 2018-10-02: qty 1

## 2018-10-02 MED ORDER — ROCURONIUM BROMIDE 50 MG/5ML IV SOSY
PREFILLED_SYRINGE | INTRAVENOUS | Status: DC | PRN
  Administered 2018-10-02: 40 mg via INTRAVENOUS

## 2018-10-02 MED ORDER — METOCLOPRAMIDE HCL 5 MG/ML IJ SOLN
5.0000 mg | Freq: Three times a day (TID) | INTRAMUSCULAR | Status: DC | PRN
  Administered 2018-10-05: 10 mg via INTRAVENOUS
  Filled 2018-10-02: qty 2

## 2018-10-02 MED ORDER — PROPOFOL 10 MG/ML IV BOLUS
INTRAVENOUS | Status: AC
  Filled 2018-10-02: qty 20

## 2018-10-02 MED ORDER — GLYCOPYRROLATE PF 0.2 MG/ML IJ SOSY
PREFILLED_SYRINGE | INTRAMUSCULAR | Status: AC
  Filled 2018-10-02: qty 1

## 2018-10-02 MED ORDER — MORPHINE SULFATE (PF) 2 MG/ML IV SOLN
0.5000 mg | INTRAVENOUS | Status: DC | PRN
  Administered 2018-10-03: 0.5 mg via INTRAVENOUS
  Filled 2018-10-02: qty 1

## 2018-10-02 MED ORDER — HYDROCODONE-ACETAMINOPHEN 5-325 MG PO TABS: 1.0000 | ORAL_TABLET | Freq: Four times a day (QID) | ORAL | 0 refills | Status: DC | PRN

## 2018-10-02 MED ORDER — LACTATED RINGERS IV SOLN
INTRAVENOUS | Status: DC | PRN
  Administered 2018-10-02: 09:00:00 via INTRAVENOUS

## 2018-10-02 MED ORDER — SUGAMMADEX SODIUM 200 MG/2ML IV SOLN
INTRAVENOUS | Status: DC | PRN
  Administered 2018-10-02: 120 mg via INTRAVENOUS

## 2018-10-02 MED ORDER — ONDANSETRON HCL 4 MG/2ML IJ SOLN
4.0000 mg | Freq: Four times a day (QID) | INTRAMUSCULAR | Status: DC | PRN
  Administered 2018-10-04: 4 mg via INTRAVENOUS
  Filled 2018-10-02: qty 2

## 2018-10-02 MED ORDER — DEXAMETHASONE SODIUM PHOSPHATE 10 MG/ML IJ SOLN
INTRAMUSCULAR | Status: DC | PRN
  Administered 2018-10-02: 5 mg via INTRAVENOUS

## 2018-10-02 MED ORDER — SODIUM CHLORIDE 0.9 % IR SOLN
Status: DC | PRN
  Administered 2018-10-02: 1000 mL

## 2018-10-02 MED ORDER — LIDOCAINE 2% (20 MG/ML) 5 ML SYRINGE
INTRAMUSCULAR | Status: DC | PRN
  Administered 2018-10-02: 40 mg via INTRAVENOUS

## 2018-10-02 MED ORDER — FENTANYL CITRATE (PF) 100 MCG/2ML IJ SOLN: 25.0000 ug | INTRAMUSCULAR | Status: DC | PRN

## 2018-10-02 MED ORDER — METHOCARBAMOL 1000 MG/10ML IJ SOLN
500.0000 mg | Freq: Four times a day (QID) | INTRAVENOUS | Status: DC | PRN
  Filled 2018-10-02: qty 5

## 2018-10-02 MED ORDER — FENTANYL CITRATE (PF) 100 MCG/2ML IJ SOLN
INTRAMUSCULAR | Status: DC | PRN
  Administered 2018-10-02: 50 ug via INTRAVENOUS
  Administered 2018-10-02 (×2): 25 ug via INTRAVENOUS
  Administered 2018-10-02: 50 ug via INTRAVENOUS

## 2018-10-02 MED ORDER — ONDANSETRON HCL 4 MG/2ML IJ SOLN
INTRAMUSCULAR | Status: AC
  Filled 2018-10-02: qty 2

## 2018-10-02 MED ORDER — EPHEDRINE 5 MG/ML INJ
INTRAVENOUS | Status: AC
  Filled 2018-10-02: qty 10

## 2018-10-02 MED ORDER — GLYCOPYRROLATE PF 0.2 MG/ML IJ SOSY
PREFILLED_SYRINGE | INTRAMUSCULAR | Status: DC | PRN
  Administered 2018-10-02: .2 mg via INTRAVENOUS

## 2018-10-02 SURGICAL SUPPLY — 48 items
BANDAGE ACE 4X5 VEL STRL LF (GAUZE/BANDAGES/DRESSINGS) ×2 IMPLANT
BLADE SURG 10 STRL SS (BLADE) ×2 IMPLANT
BNDG COHESIVE 4X5 TAN STRL (GAUZE/BANDAGES/DRESSINGS) ×3 IMPLANT
BNDG ESMARK 4X9 LF (GAUZE/BANDAGES/DRESSINGS) ×2 IMPLANT
CANISTER SUCT 3000ML PPV (MISCELLANEOUS) IMPLANT
COVER SURGICAL LIGHT HANDLE (MISCELLANEOUS) ×3 IMPLANT
CUFF TOURNIQUET SINGLE 18IN (TOURNIQUET CUFF) ×3 IMPLANT
DRAPE IMP U-DRAPE 54X76 (DRAPES) ×3 IMPLANT
DRAPE ORTHO SPLIT 77X108 STRL (DRAPES) ×4
DRAPE SURG ORHT 6 SPLT 77X108 (DRAPES) ×2 IMPLANT
DRAPE U-SHAPE 47X51 STRL (DRAPES) ×3 IMPLANT
DRSG ADAPTIC 3X8 NADH LF (GAUZE/BANDAGES/DRESSINGS) ×2 IMPLANT
DRSG PAD ABDOMINAL 8X10 ST (GAUZE/BANDAGES/DRESSINGS) ×2 IMPLANT
DURAPREP 26ML APPLICATOR (WOUND CARE) ×3 IMPLANT
ELECT REM PT RETURN 9FT ADLT (ELECTROSURGICAL) ×3
ELECTRODE REM PT RTRN 9FT ADLT (ELECTROSURGICAL) ×1 IMPLANT
GAUZE SPONGE 4X4 12PLY STRL (GAUZE/BANDAGES/DRESSINGS) ×2 IMPLANT
GLOVE BIO SURGEON STRL SZ7.5 (GLOVE) ×6 IMPLANT
GLOVE BIOGEL PI IND STRL 8 (GLOVE) ×2 IMPLANT
GLOVE BIOGEL PI INDICATOR 8 (GLOVE) ×4
GOWN STRL REUS W/ TWL LRG LVL3 (GOWN DISPOSABLE) ×2 IMPLANT
GOWN STRL REUS W/ TWL XL LVL3 (GOWN DISPOSABLE) ×1 IMPLANT
GOWN STRL REUS W/TWL LRG LVL3 (GOWN DISPOSABLE) ×4
GOWN STRL REUS W/TWL XL LVL3 (GOWN DISPOSABLE) ×2
K-WIRE DBL TROCAR .045X4 ×6 IMPLANT
KIT BASIN OR (CUSTOM PROCEDURE TRAY) ×3 IMPLANT
KIT TURNOVER KIT B (KITS) ×3 IMPLANT
KWIRE DBL TROCAR .045X4 IMPLANT
MANIFOLD NEPTUNE II (INSTRUMENTS) IMPLANT
NS IRRIG 1000ML POUR BTL (IV SOLUTION) ×3 IMPLANT
PACK ORTHO EXTREMITY (CUSTOM PROCEDURE TRAY) ×3 IMPLANT
PAD ARMBOARD 7.5X6 YLW CONV (MISCELLANEOUS) ×3 IMPLANT
PAD CAST 4YDX4 CTTN HI CHSV (CAST SUPPLIES) IMPLANT
PADDING CAST COTTON 4X4 STRL (CAST SUPPLIES) ×2
SPLINT PLASTER CAST XFAST 5X30 (CAST SUPPLIES) IMPLANT
SPLINT PLASTER XFAST SET 5X30 (CAST SUPPLIES) ×2
SUCTION FRAZIER HANDLE 10FR (MISCELLANEOUS) ×2
SUCTION TUBE FRAZIER 10FR DISP (MISCELLANEOUS) ×1 IMPLANT
SUT FIBERWIRE #2 38 REV NDL BL (SUTURE)
SUT VIC AB 0 CT1 27 (SUTURE) ×2
SUT VIC AB 0 CT1 27XBRD ANBCTR (SUTURE) IMPLANT
SUT VIC AB 2-0 FS1 27 (SUTURE) ×2 IMPLANT
SUTURE FIBERWR#2 38 REV NDL BL (SUTURE) IMPLANT
TOWEL OR 17X26 10 PK STRL BLUE (TOWEL DISPOSABLE) ×3 IMPLANT
TUBE CONNECTING 12'X1/4 (SUCTIONS) ×1
TUBE CONNECTING 12X1/4 (SUCTIONS) ×2 IMPLANT
UNDERPAD 30X30 (UNDERPADS AND DIAPERS) ×3 IMPLANT
YANKAUER SUCT BULB TIP NO VENT (SUCTIONS) ×3 IMPLANT

## 2018-10-02 NOTE — Anesthesia Procedure Notes (Signed)
Procedure Name: Intubation Date/Time: 10/02/2018 9:49 AM Performed by: Fransisca KaufmannMeyer, Aireonna Bauer E, CRNA Pre-anesthesia Checklist: Patient identified, Emergency Drugs available, Suction available and Patient being monitored Patient Re-evaluated:Patient Re-evaluated prior to induction Oxygen Delivery Method: Circle System Utilized Preoxygenation: Pre-oxygenation with 100% oxygen Induction Type: IV induction Ventilation: Mask ventilation without difficulty Laryngoscope Size: Miller and 2 Grade View: Grade I Tube type: Oral Tube size: 7.0 mm Number of attempts: 1 Airway Equipment and Method: Stylet and Oral airway Placement Confirmation: ETT inserted through vocal cords under direct vision,  positive ETCO2 and breath sounds checked- equal and bilateral Secured at: 22 cm Tube secured with: Tape Dental Injury: Teeth and Oropharynx as per pre-operative assessment

## 2018-10-02 NOTE — Progress Notes (Signed)
Family Medicine Teaching Service Daily Progress Note Intern Pager: [phone]  Patient name: Mia Rogers Medical record number: [id number] Date of birth: 17-Feb-1928 Age: 84 y.o. Gender: female  Primary Care Provider: Manus Gunning, FNP Consultants: Mia Rogers, ENT Code Status: Full  Assessment and Plan: Mia Rogers is a 84 y.o. female presenting after a mechanical fall with maxillary fractures and olecranon fracture. Marland Kitchen PMH is significant for hypothyroid, dementia.   R maxilla and olecranon fractures 2/2 mechanical fall: Acute.  Neurovascularly intact on admit.  Hemoglobin stable.  No surgical intervention via ENT for her maxilla fractures, will heal nicely on their own. Going to the OR today via Ortho for ORIF of her olecranon fracture. -Ortho on board, surgery planned for this morning -Pain control: Tylenol thousand milligrams 3 times daily, morphine 1 mg every 4 as needed -Will revisit pain control as needed after surgery -SCDs - PT/OT following surgery  AKI: Acute CR 1.35 today, 1.05 on admit.  Potentially secondary to dehydration. - Monitor fluid/p.o. intake following surgery - Start 50 mL/hour NS   Hypothyroidism: Chronic, stable. Follows with PCP for TSH regularly.  - continue home Synthroid 50 mcg  Dementia: Chronic, stable.   Her son is HCPOA.  - Continue home aricept  - Continue home seroquel  Essential tremor: Chronic, stable.   Tremor of head, neck, and arms visible on admit. - Continue home propranolol  FEN/GI: N.p.o. for surgery today Prophylaxis:  SCDs  Disposition: Continue care, ORIF surgery today  Subjective:  Unable to see this morning due to already in the OR for surgery.  We will see her this afternoon during recovery.  Per night team, patient did well overnight, no acute events.  Objective: Temp:  [97.8 F (36.6 C)-98.6 F (37 C)] 97.8 F (36.6 C) (12/07 0536) Pulse Rate:  [60-80] 60 (12/07 0536) Resp:  [6-20] 20 (12/07 0536) BP:  (88-190)/(56-146) 104/63 (12/07 0536) SpO2:  [93 %-100 %] 97 % (12/07 0536) Weight:  [59 kg] 59 kg (12/06 0926) Physical Exam: Will evaluate patient in the afternoon after surgery.  Laboratory: Recent Labs  Lab 10/01/18 0950 10/02/18 0514  WBC 7.5 6.7  HGB 12.7 11.5*  HCT 40.9 36.5  PLT 296 259   Recent Labs  Lab 10/01/18 0950 10/02/18 0514  NA 140 137  K 3.9 3.6  CL 106 102  CO2 24 24  BUN 9 18  CREATININE 1.05* 1.35*  CALCIUM 9.3 8.6*  GLUCOSE 109* 112*     Imaging/Diagnostic Tests: Dg Ribs Unilateral W/chest Right  Result Date: 10/01/2018 CLINICAL DATA:  Recent fall with right-sided chest pain, initial encounter EXAM: RIGHT RIBS AND CHEST - 3+ VIEW COMPARISON:  05/30/2018 FINDINGS: Cardiac shadow is enlarged. Aortic calcifications are seen. No acute rib fracture is noted. Lung is well aerated. Stable compression deformities in the thoracic spine are seen. IMPRESSION: No acute rib fracture noted. Electronically Signed   By: Alcide Clever M.D.   On: 10/01/2018 10:53   Dg Shoulder Right  Result Date: 10/01/2018 CLINICAL DATA:  Recent fall today with right shoulder pain, initial encounter EXAM: RIGHT SHOULDER - 2+ VIEW COMPARISON:  None. FINDINGS: Degenerative changes of the acromioclavicular joint are seen. No acute fracture or dislocation is noted. The underlying bony thorax appears within normal limits. No soft tissue changes are seen. IMPRESSION: Degenerative change without acute abnormality. Electronically Signed   By: Alcide Clever M.D.   On: 10/01/2018 10:55   Dg Elbow Complete Right  Result Date: 10/01/2018 CLINICAL DATA:  Recent fall  with elbow pain, initial encounter EXAM: RIGHT ELBOW - COMPLETE 3+ VIEW COMPARISON:  None. FINDINGS: There is an olecranon fracture with distraction of the proximal fracture fragment by approximately 1 cm. Joint effusion is noted. No other fractures are seen. A small bony density is noted medially on the frontal films likely related to a  fragment from the olecranon. IMPRESSION: Olecranon fracture with distraction. Electronically Signed   By: Alcide CleverMark  Lukens M.D.   On: 10/01/2018 10:51   Ct Head Wo Contrast  Result Date: 10/01/2018 CLINICAL DATA:  84 year old female with head, neck and facial pain following fall. RIGHT facial hematoma. EXAM: CT HEAD WITHOUT CONTRAST CT MAXILLOFACIAL WITHOUT CONTRAST CT CERVICAL SPINE WITHOUT CONTRAST TECHNIQUE: Multidetector CT imaging of the head, cervical spine, and maxillofacial structures were performed using the standard protocol without intravenous contrast. Multiplanar CT image reconstructions of the cervical spine and maxillofacial structures were also generated. COMPARISON:  None. FINDINGS: CT HEAD FINDINGS Brain: No evidence of acute infarction, hemorrhage, hydrocephalus, extra-axial collection or mass lesion/mass effect. Atrophy and mild probable chronic small-vessel white matter ischemic changes noted. Vascular: Carotid atherosclerotic calcifications noted. Skull: No acute calvarial abnormality identified. Other: RIGHT forehead soft tissue swelling identified. CT MAXILLOFACIAL FINDINGS Osseous: Fractures of the ANTERIOR, LATERAL and MEDIAL RIGHT maxillary sinus walls noted. There is 1.5 mm displacement of the ANTERIOR RIGHT maxillary sinus wall fracture. The other fractures are nondisplaced. No other acute fracture, subluxation or dislocation identified. Orbits: Negative. No traumatic or inflammatory finding. Sinuses: Fluid in the RIGHT maxillary and bilateral sphenoid sinuses noted. Soft tissues: RIGHT facial soft tissue swelling identified. CT CERVICAL SPINE FINDINGS Alignment: Normal. Skull base and vertebrae: No acute fracture. No primary bone lesion or focal pathologic process. Soft tissues and spinal canal: No prevertebral fluid or swelling. No visible canal hematoma. Disc levels: Moderate to severe multilevel degenerative disc disease and facet arthropathy noted. Upper chest: No acute abnormality  Other: None IMPRESSION: 1. No evidence of acute intracranial abnormality. Atrophy and probable mild chronic small-vessel white matter ischemic changes. 2. Fractures of the ANTERIOR, LATERAL and MEDIAL walls of the RIGHT maxillary sinus with overlying soft tissue swelling. 3. Small amount of fluid within the sphenoid sinuses without sphenoid or temporal bone fractures identified. 4. No static evidence of acute injury to the cervical spine. Moderate to severe multilevel degenerative changes. Electronically Signed   By: Harmon PierJeffrey  Hu M.D.   On: 10/01/2018 11:18   Ct Cervical Spine Wo Contrast  Result Date: 10/01/2018 CLINICAL DATA:  84 year old female with head, neck and facial pain following fall. RIGHT facial hematoma. EXAM: CT HEAD WITHOUT CONTRAST CT MAXILLOFACIAL WITHOUT CONTRAST CT CERVICAL SPINE WITHOUT CONTRAST TECHNIQUE: Multidetector CT imaging of the head, cervical spine, and maxillofacial structures were performed using the standard protocol without intravenous contrast. Multiplanar CT image reconstructions of the cervical spine and maxillofacial structures were also generated. COMPARISON:  None. FINDINGS: CT HEAD FINDINGS Brain: No evidence of acute infarction, hemorrhage, hydrocephalus, extra-axial collection or mass lesion/mass effect. Atrophy and mild probable chronic small-vessel white matter ischemic changes noted. Vascular: Carotid atherosclerotic calcifications noted. Skull: No acute calvarial abnormality identified. Other: RIGHT forehead soft tissue swelling identified. CT MAXILLOFACIAL FINDINGS Osseous: Fractures of the ANTERIOR, LATERAL and MEDIAL RIGHT maxillary sinus walls noted. There is 1.5 mm displacement of the ANTERIOR RIGHT maxillary sinus wall fracture. The other fractures are nondisplaced. No other acute fracture, subluxation or dislocation identified. Orbits: Negative. No traumatic or inflammatory finding. Sinuses: Fluid in the RIGHT maxillary and bilateral sphenoid sinuses noted.  Soft tissues: RIGHT facial soft tissue swelling identified. CT CERVICAL SPINE FINDINGS Alignment: Normal. Skull base and vertebrae: No acute fracture. No primary bone lesion or focal pathologic process. Soft tissues and spinal canal: No prevertebral fluid or swelling. No visible canal hematoma. Disc levels: Moderate to severe multilevel degenerative disc disease and facet arthropathy noted. Upper chest: No acute abnormality Other: None IMPRESSION: 1. No evidence of acute intracranial abnormality. Atrophy and probable mild chronic small-vessel white matter ischemic changes. 2. Fractures of the ANTERIOR, LATERAL and MEDIAL walls of the RIGHT maxillary sinus with overlying soft tissue swelling. 3. Small amount of fluid within the sphenoid sinuses without sphenoid or temporal bone fractures identified. 4. No static evidence of acute injury to the cervical spine. Moderate to severe multilevel degenerative changes. Electronically Signed   By: Harmon Pier M.D.   On: 10/01/2018 11:18   Dg Knee Complete 4 Views Right  Result Date: 10/01/2018 CLINICAL DATA:  Right knee pain following fall today, initial encounter EXAM: RIGHT KNEE - COMPLETE 4+ VIEW COMPARISON:  None. FINDINGS: No evidence of fracture, dislocation, or joint effusion. No evidence of arthropathy or other focal bone abnormality. Soft tissues are unremarkable. IMPRESSION: No acute fracture noted. Electronically Signed   By: Alcide Clever M.D.   On: 10/01/2018 10:54   Dg Hand Complete Right  Result Date: 10/01/2018 CLINICAL DATA:  Recent fall with right hand pain, initial encounter EXAM: RIGHT HAND - COMPLETE 3+ VIEW COMPARISON:  None. FINDINGS: Mild degenerative changes are noted most prominent at the third MCP joint as well as in the radiocarpal articulation. No fracture or dislocation is seen. No gross soft tissue abnormality is noted. IMPRESSION: Degenerative changes without acute abnormality. Electronically Signed   By: Alcide Clever M.D.   On:  10/01/2018 10:53   Dg Hip Unilat W Or Wo Pelvis 2-3 Views Right  Result Date: 10/01/2018 CLINICAL DATA:  Recent fall with right hip pain, initial encounter EXAM: DG HIP (WITH OR WITHOUT PELVIS) 2-3V RIGHT COMPARISON:  None. FINDINGS: There are changes consistent with prior superior and inferior pubic rami fractures with healing bilaterally. No acute fracture or dislocation is seen. No soft tissue abnormality is noted. IMPRESSION: Old fractures of the pubic rami bilaterally. No acute abnormality noted. Electronically Signed   By: Alcide Clever M.D.   On: 10/01/2018 10:54   Ct Maxillofacial Wo Cm  Result Date: 10/01/2018 CLINICAL DATA:  84 year old female with head, neck and facial pain following fall. RIGHT facial hematoma. EXAM: CT HEAD WITHOUT CONTRAST CT MAXILLOFACIAL WITHOUT CONTRAST CT CERVICAL SPINE WITHOUT CONTRAST TECHNIQUE: Multidetector CT imaging of the head, cervical spine, and maxillofacial structures were performed using the standard protocol without intravenous contrast. Multiplanar CT image reconstructions of the cervical spine and maxillofacial structures were also generated. COMPARISON:  None. FINDINGS: CT HEAD FINDINGS Brain: No evidence of acute infarction, hemorrhage, hydrocephalus, extra-axial collection or mass lesion/mass effect. Atrophy and mild probable chronic small-vessel white matter ischemic changes noted. Vascular: Carotid atherosclerotic calcifications noted. Skull: No acute calvarial abnormality identified. Other: RIGHT forehead soft tissue swelling identified. CT MAXILLOFACIAL FINDINGS Osseous: Fractures of the ANTERIOR, LATERAL and MEDIAL RIGHT maxillary sinus walls noted. There is 1.5 mm displacement of the ANTERIOR RIGHT maxillary sinus wall fracture. The other fractures are nondisplaced. No other acute fracture, subluxation or dislocation identified. Orbits: Negative. No traumatic or inflammatory finding. Sinuses: Fluid in the RIGHT maxillary and bilateral sphenoid sinuses  noted. Soft tissues: RIGHT facial soft tissue swelling identified. CT CERVICAL SPINE FINDINGS Alignment: Normal.  Skull base and vertebrae: No acute fracture. No primary bone lesion or focal pathologic process. Soft tissues and spinal canal: No prevertebral fluid or swelling. No visible canal hematoma. Disc levels: Moderate to severe multilevel degenerative disc disease and facet arthropathy noted. Upper chest: No acute abnormality Other: None IMPRESSION: 1. No evidence of acute intracranial abnormality. Atrophy and probable mild chronic small-vessel white matter ischemic changes. 2. Fractures of the ANTERIOR, LATERAL and MEDIAL walls of the RIGHT maxillary sinus with overlying soft tissue swelling. 3. Small amount of fluid within the sphenoid sinuses without sphenoid or temporal bone fractures identified. 4. No static evidence of acute injury to the cervical spine. Moderate to severe multilevel degenerative changes. Electronically Signed   By: Harmon Pier M.D.   On: 10/01/2018 11:18    Allayne Stack, DO 10/02/2018, 8:03 AM PGY-1, [facility] FPTS Intern pager: [phone], text pages welcome

## 2018-10-02 NOTE — Anesthesia Preprocedure Evaluation (Addendum)
Anesthesia Evaluation  Patient identified by MRN, date of birth, ID band Patient awake    Reviewed: Allergy & Precautions, NPO status , Patient's Chart, lab work & pertinent test results  History of Anesthesia Complications Negative for: history of anesthetic complications  Airway Mallampati: I  TM Distance: >3 FB Neck ROM: Full    Dental  (+) Dental Advisory Given, Teeth Intact   Pulmonary neg pulmonary ROS,    breath sounds clear to auscultation       Cardiovascular negative cardio ROS   Rhythm:Regular Rate:Normal     Neuro/Psych PSYCHIATRIC DISORDERS Dementia  Essential tremor     GI/Hepatic negative GI ROS, Neg liver ROS,   Endo/Other  Hypothyroidism   Renal/GU Renal InsufficiencyRenal disease     Musculoskeletal negative musculoskeletal ROS (+)   Abdominal   Peds  Hematology negative hematology ROS (+)   Anesthesia Other Findings   Reproductive/Obstetrics                            Anesthesia Physical Anesthesia Plan  ASA: II  Anesthesia Plan: General   Post-op Pain Management:  Regional for Post-op pain   Induction: Intravenous  PONV Risk Score and Plan: 3 and Treatment may vary due to age or medical condition, Ondansetron and Propofol infusion  Airway Management Planned: LMA  Additional Equipment: None  Intra-op Plan:   Post-operative Plan: Extubation in OR  Informed Consent: I have reviewed the patients History and Physical, chart, labs and discussed the procedure including the risks, benefits and alternatives for the proposed anesthesia with the patient or authorized representative who has indicated his/her understanding and acceptance.   Dental advisory given  Plan Discussed with: CRNA and Anesthesiologist  Anesthesia Plan Comments:        Anesthesia Quick Evaluation

## 2018-10-02 NOTE — Op Note (Signed)
10/02/2018  12:36 PM  PATIENT:  Mia Rogers    PRE-OPERATIVE DIAGNOSIS:  Right olecranon fx  POST-OPERATIVE DIAGNOSIS:  Same  PROCEDURE:  OPEN REDUCTION INTERNAL FIXATION (ORIF) ELBOW/OLECRANON FRACTURE  SURGEON:  Sheral Apleyimothy D Bralyn Folkert, MD  ASSISTANT: Aquilla HackerHenry Martensen, PA-C, he was present and scrubbed throughout the case, critical for completion in a timely fashion, and for retraction, instrumentation, and closure.   ANESTHESIA:   gen  PREOPERATIVE INDICATIONS:  Mia Rogers is a  84 y.o. female with a diagnosis of Right olecranon fx who failed conservative measures and elected for surgical management.    The risks benefits and alternatives were discussed with the patient preoperatively including but not limited to the risks of infection, bleeding, nerve injury, cardiopulmonary complications, the need for revision surgery, among others, and the patient was willing to proceed.  OPERATIVE IMPLANTS: tension band  OPERATIVE FINDINGS: olecranon fx  BLOOD LOSS: min  COMPLICATIONS: none  TOURNIQUET TIME: 45min  OPERATIVE PROCEDURE:  Patient was identified in the preoperative holding area and site was marked by me She was transported to the operating theater and placed on the table in supine position taking care to pad all bony prominences. After a preincinduction time out anesthesia was induced. The right extremity was prepped and draped in normal sterile fashion and a pre-incision timeout was performed. She received ancef for preoperative antibiotics.   I made a longitudinal incision over her olecranon.  I dissected down to her fracture site.  I debrided the fracture of any soft tissue.  I then reduced the fracture and held it in place with an tenaculum I was happy with the anatomic alignment at the cortex.  I placed 2 K wires across the fracture site.  I took multiple x-rays to confirm appropriate placement of the K wires and no intra-articular penetration.  I then drilled a  distal hole and passed a FiberWire stitch rather a fiber tape stitch through this in a figure-of-eight fashion around the K wires I secured this in place and tied this figure-of-eight.  I then bent and cut the K wires I do not these within the triceps tendon I took multiple x-rays I was happy with the anatomic reduction and placement of all hardware.  I then thoroughly irrigated her incision and closed her skin in layers sterile dressings were applied she was placed in a sling  POST OPERATIVE PLAN: sling full time. Mobilize.

## 2018-10-02 NOTE — Transfer of Care (Signed)
Immediate Anesthesia Transfer of Care Note  Patient: Mia ReddenBonnie Pixler  Procedure(s) Performed: OPEN REDUCTION INTERNAL FIXATION (ORIF) ELBOW/OLECRANON FRACTURE (Right )  Patient Location: PACU  Anesthesia Type:General  Level of Consciousness: awake, alert , oriented and sedated  Airway & Oxygen Therapy: Patient Spontanous Breathing and Patient connected to face mask oxygen  Post-op Assessment: Report given to RN, Post -op Vital signs reviewed and stable and Patient moving all extremities  Post vital signs: Reviewed and stable  Last Vitals:  Vitals Value Taken Time  BP 135/75 10/02/2018 11:20 AM  Temp    Pulse 72 10/02/2018 11:24 AM  Resp 12 10/02/2018 11:24 AM  SpO2 100 % 10/02/2018 11:24 AM  Vitals shown include unvalidated device data.  Last Pain:  Vitals:   10/02/18 0823  TempSrc: Oral  PainSc:          Complications: No apparent anesthesia complications

## 2018-10-02 NOTE — Anesthesia Procedure Notes (Signed)
Anesthesia Regional Block: Supraclavicular block   Pre-Anesthetic Checklist: ,, timeout performed, Correct Patient, Correct Site, Correct Laterality, Correct Procedure, Correct Position, site marked, Risks and benefits discussed,  Surgical consent,  Pre-op evaluation,  At surgeon's request and post-op pain management  Laterality: Right  Prep: chloraprep       Needles:  Injection technique: Single-shot  Needle Type: Echogenic Needle     Needle Length: 5cm  Needle Gauge: 21     Additional Needles:   Narrative:  Start time: 10/02/2018 9:24 AM End time: 10/02/2018 9:27 AM Injection made incrementally with aspirations every 5 mL.  Performed by: Personally  Anesthesiologist: Beryle LatheBrock, Ezreal Turay E, MD  Additional Notes: No pain on injection. No increased resistance to injection. Injection made in 5cc increments. Good needle visualization. Patient tolerated the procedure well.

## 2018-10-02 NOTE — Anesthesia Postprocedure Evaluation (Signed)
Anesthesia Post Note  Patient: Mia Rogers  Procedure(s) Performed: OPEN REDUCTION INTERNAL FIXATION (ORIF) ELBOW/OLECRANON FRACTURE (Right )     Patient location during evaluation: PACU Anesthesia Type: General Level of consciousness: awake and alert Pain management: pain level controlled Vital Signs Assessment: post-procedure vital signs reviewed and stable Respiratory status: spontaneous breathing, nonlabored ventilation and respiratory function stable Cardiovascular status: blood pressure returned to baseline and stable Postop Assessment: no apparent nausea or vomiting Anesthetic complications: no    Last Vitals:  Vitals:   10/02/18 1151 10/02/18 1208  BP: (!) 141/84 131/74  Pulse: (!) 54 64  Resp: 12 18  Temp:    SpO2: 97% 97%    Last Pain:  Vitals:   10/02/18 1140  TempSrc:   PainSc: 0-No pain                 Beryle Lathehomas E Brock

## 2018-10-02 NOTE — Interval H&P Note (Signed)
I participated in the care of this patient and agree with the above history, physical and evaluation. I performed a review of the history and a physical exam as detailed   Briannia Laba Daniel Rufus Cypert MD  

## 2018-10-02 NOTE — Discharge Instructions (Signed)
Right Elbow Fracture:  Dressing:  Keep dressings on and dry until follow up.  Weight Bearing:   Non weight bearing right arm.  Maintain sling at all times.  Elevate arm to reduce pain / swelling.  To prevent constipation: you may use a stool softener such as -  Colace (over the counter) 100 mg by mouth twice a day  Drink plenty of fluids (prune juice may be helpful) and high fiber foods Miralax (over the counter) for constipation as needed.    Precautions:  If you experience chest pain or shortness of breath - call 911 immediately for transfer to the hospital emergency department!!                                     Follow- Up Appointment:  Please call for an appointment to be seen in 1-2 weeks by Dr. Wandra Feinstein. [phone]

## 2018-10-03 LAB — BASIC METABOLIC PANEL
Anion gap: 12 (ref 5–15)
BUN: 13 mg/dL (ref 8–23)
CO2: 24 mmol/L (ref 22–32)
Calcium: 8.2 mg/dL — ABNORMAL LOW (ref 8.9–10.3)
Chloride: 95 mmol/L — ABNORMAL LOW (ref 98–111)
Creatinine, Ser: 0.79 mg/dL (ref 0.44–1.00)
GFR calc Af Amer: >60 mL/min (ref >60)
GFR calc non Af Amer: >60 mL/min (ref >60)
Glucose, Bld: 126 mg/dL — ABNORMAL HIGH (ref 70–99)
Potassium: 3.3 mmol/L — ABNORMAL LOW (ref 3.5–5.1)
Sodium: 131 mmol/L — ABNORMAL LOW (ref 135–145)

## 2018-10-03 LAB — CBC WITH DIFFERENTIAL/PLATELET
Abs Immature Granulocytes: 0.03 10*3/uL (ref 0.00–0.07)
Basophils Absolute: 0 10*3/uL (ref 0.0–0.1)
Basophils Relative: 0 %
EOS ABS: 0.1 10*3/uL (ref 0.0–0.5)
Eosinophils Relative: 1 %
HCT: 32.4 % — ABNORMAL LOW (ref 36.0–46.0)
Hemoglobin: 10.4 g/dL — ABNORMAL LOW (ref 12.0–15.0)
IMMATURE GRANULOCYTES: 0 %
Lymphocytes Relative: 18 %
Lymphs Abs: 1.8 10*3/uL (ref 0.7–4.0)
MCH: 28.4 pg (ref 26.0–34.0)
MCHC: 32.1 g/dL (ref 30.0–36.0)
MCV: 88.5 fL (ref 80.0–100.0)
Monocytes Absolute: 0.8 10*3/uL (ref 0.1–1.0)
Monocytes Relative: 8 %
NEUTROS PCT: 73 %
Neutro Abs: 7.1 10*3/uL (ref 1.7–7.7)
Platelets: 217 10*3/uL (ref 150–400)
RBC: 3.66 MIL/uL — ABNORMAL LOW (ref 3.87–5.11)
RDW: 13.8 % (ref 11.5–15.5)
WBC: 9.8 10*3/uL (ref 4.0–10.5)
nRBC: 0 % (ref 0.0–0.2)

## 2018-10-03 MED ORDER — POTASSIUM CHLORIDE CRYS ER 20 MEQ PO TBCR
40.0000 meq | EXTENDED_RELEASE_TABLET | Freq: Two times a day (BID) | ORAL | Status: AC
  Administered 2018-10-03 (×2): 40 meq via ORAL
  Filled 2018-10-03 (×2): qty 2

## 2018-10-03 MED ORDER — OXYCODONE HCL 5 MG PO TABS
5.0000 mg | ORAL_TABLET | ORAL | Status: DC | PRN
  Administered 2018-10-03 – 2018-10-04 (×3): 5 mg via ORAL
  Filled 2018-10-03 (×3): qty 1

## 2018-10-03 MED ORDER — HALOPERIDOL LACTATE 5 MG/ML IJ SOLN
2.0000 mg | Freq: Once | INTRAMUSCULAR | Status: AC
  Administered 2018-10-03: 2 mg via INTRAVENOUS
  Filled 2018-10-03: qty 1

## 2018-10-03 MED ORDER — HALOPERIDOL LACTATE 5 MG/ML IJ SOLN: 5.0000 mg | Freq: Once | INTRAMUSCULAR | Status: DC

## 2018-10-03 MED ORDER — ALUM & MAG HYDROXIDE-SIMETH 200-200-20 MG/5ML PO SUSP
30.0000 mL | Freq: Four times a day (QID) | ORAL | Status: DC | PRN
  Administered 2018-10-03 – 2018-10-06 (×6): 30 mL via ORAL
  Filled 2018-10-03 (×6): qty 30

## 2018-10-03 NOTE — Progress Notes (Addendum)
Family Medicine Teaching Service Daily Progress Note Intern Pager: [phone]  Patient name: Mia Rogers Medical record number: [id number] Date of birth: 26-Dec-1927 Age: 84 y.o. Gender: female  Primary Care Provider: Manus GunningMazurek, Maggie, FNP Consultants: Gaylord Shihrtho, ENT Code Status: Full  Assessment and Plan: Mia Rogers is a 84 y.o. female presenting after a mechanical fall with maxillary fractures and olecranon fracture. Marland Kitchen. PMH is significant for hypothyroidism, dementia.   R maxilla and olecranon fractures 2/2 mechanical fall, s/p ORIF  POD 1 after ORIF of R olecranon fracture.  Goals include pain control and PT/OT.  No intervention from ENT standpoint.  No other bony injuries utilized on other x-rays of right side. -Ortho on board, appreciate recs -Pain control: Tylenol 1,000 mg TID, Norco 5-325 1-2 tab Q6H PRN moderate pain, morphine 0.5 mg Q3H PRN for severe pain.  Can increase dose of morphine if needed to control pain. -SCDs - PT/OT following surgery  AKI: Resolved CR 0.79 on 12/8 after rising to 1.35 on 12/7 after being 1.05 on admit.   - Monitor fluid/p.o. intake   Hypothyroidism: Chronic, stable. Follows with PCP for TSH regularly.  - continue home Synthroid 50 mcg  Dementia: Chronic, stable.   Her son is HCPOA.  Patient had an episode of agitation, likely due to delirium, on night of 12/7 and could not be redirected, so she received Haldol 2 mg once.  She denied pain at that time but did receive pain medication about 1 hour afterward per her request. - Continue home aricept  - Continue home seroquel -Continue to reorient and will discharge as soon as it is medically safe  Essential tremor: Chronic, stable.   Tremor of head, neck, and arms visible on admit. - Continue home propranolol  FEN/GI: Heart healthy diet Prophylaxis:  SCDs  Disposition: Discharge pending PT/OT recs, Ortho recs  Subjective:  Patient was comfortably resting in bed and was asleep during my  exam, however daughter was at bedside.  Daughter was worried that patient may have injured her right hip she walks with a bit of a limp.  Objective: Temp:  [97.6 F (36.4 C)-98.7 F (37.1 C)] 98.7 F (37.1 C) (12/07 2045) Pulse Rate:  [54-87] 61 (12/07 2045) Resp:  [11-20] 18 (12/07 2045) BP: (96-145)/(63-85) 128/67 (12/07 2045) SpO2:  [94 %-100 %] 96 % (12/07 2045) Physical Exam: General: Lying comfortably in bed asleep, appears elderly, intermittently will shake her head spontaneously Cardio: RRR, no MRG Pulm: CTA B, no increased work of breathing Abdomen: Soft, nontender, nondistended, active bowel sounds Extremities: No edema, some tenderness to palpation of right hip but no bruising noted Psych: Unable to assess since patient was asleep, but she did nod in response to her daughter's voice  Laboratory: Recent Labs  Lab 10/01/18 0950 10/02/18 0514  WBC 7.5 6.7  HGB 12.7 11.5*  HCT 40.9 36.5  PLT 296 259   Recent Labs  Lab 10/01/18 0950 10/02/18 0514  NA 140 137  K 3.9 3.6  CL 106 102  CO2 24 24  BUN 9 18  CREATININE 1.05* 1.35*  CALCIUM 9.3 8.6*  GLUCOSE 109* 112*     Imaging/Diagnostic Tests: No results found.  Lennox SoldersWinfrey,  C, MD 10/03/2018, 12:36 AM PGY-2, [facility] FPTS Intern pager: [phone], text pages welcome

## 2018-10-03 NOTE — Plan of Care (Signed)
  Problem: Activity: Goal: Risk for activity intolerance will decrease Outcome: Progressing   Problem: Coping: Goal: Level of anxiety will decrease Outcome: Progressing   Problem: Elimination: Goal: Will not experience complications related to bowel motility Outcome: Progressing   Problem: Safety: Goal: Ability to remain free from injury will improve Outcome: Progressing   Problem: Pain Managment: Goal: General experience of comfort will improve Outcome: Progressing

## 2018-10-03 NOTE — Evaluation (Signed)
Physical Therapy Evaluation Patient Details Name: Mia Rogers MRN: [id number] DOB: 06-12-1928 Today's Date: 10/03/2018   History of Present Illness  Pt is an 84 y/o female with PMHx including Dementia, Essential tremor, and Hypothyroid. Pt presenting after a mechanical fall sustaning maxillary fractures and R olecranon fracture. Pt is now s/p ORIF R olecranon fracture.     Clinical Impression  Mia Rogers is a pleasant 84 y/o female admitted with the above lsited diagnosis. Patient a resident of ALF prior to admission where she was Mod I with mobility. Patient today requiring general Min A +2 for transfers and mobility; requiring use of HHA at this time sue to R hip pain with limited weight bearing on this LE. Feel as if patient would benefit return to ALF as it is a familiar environment if they are able to provide 24/hr supervision and therapies. PT to continue to follow to maximize safe functional mobility.     Follow Up Recommendations Home health PT;Supervision/Assistance - 24 hour(pending if AFL can provide 24/hr care and therapies)    Equipment Recommendations  Other (comment)(defer)    Recommendations for Other Services OT consult(as ordered)     Precautions / Restrictions Precautions Precautions: Fall Required Braces or Orthoses: Sling Restrictions Weight Bearing Restrictions: Yes RUE Weight Bearing: Non weight bearing      Mobility  Bed Mobility Overal bed mobility: Needs Assistance Bed Mobility: Supine to Sit     Supine to sit: Min assist     General bed mobility comments: pt transitioning into long sitting without assist; requires assist for RLE and to scoot hips towards EOB  Transfers Overall transfer level: Needs assistance Equipment used: 1 person hand held assist Transfers: Sit to/from [employer] CorporationStand;Stand Pivot Transfers Sit to Stand: Min assist;+2 physical assistance;+2 safety/equipment Stand pivot transfers: Min assist;+2 physical assistance;+2  safety/equipment;Mod assist       General transfer comment: assist to rise and steady in standing; HHA utilized for LUE; pt requires +2 steadying assist to ambulate few steps in room towards recliner; heavy reliance on LUE/external support when moving LLE forward  Ambulation/Gait Ambulation/Gait assistance: Min assist;+2 physical assistance;+2 safety/equipment Gait Distance (Feet): 8 Feet Assistive device: 2 person hand held assist Gait Pattern/deviations: Step-to pattern;Decreased stance time - right;Decreased weight shift to right;Antalgic Gait velocity: decreased   General Gait Details: antalgic gait pattern with pain with R LE weight bearing. Patient requiring Min A to complete successfully  Stairs            Wheelchair Mobility    Modified Rankin (Stroke Patients Only)       Balance Overall balance assessment: Needs assistance Sitting-balance support: Feet supported Sitting balance-Leahy Scale: Good     Standing balance support: Single extremity supported Standing balance-Leahy Scale: Poor Standing balance comment: reliant on UE support/external support                             Pertinent Vitals/Pain Pain Assessment: Faces Faces Pain Scale: Hurts little more Pain Location: R elbow; R hip Pain Descriptors / Indicators: Discomfort;Guarding;Sore Pain Intervention(s): Limited activity within patient's tolerance;Monitored during session;Repositioned    Home Living Family/patient expects to be discharged to:: Assisted living                 Additional Comments: [facility] - does not ambulate with assistance; facility provides meals; does not require supervision for bathing/dressing    Prior Function Level of Independence: Independent  Hand Dominance   Dominant Hand: Right    Extremity/Trunk Assessment   Upper Extremity Assessment Upper Extremity Assessment: Defer to OT evaluation RUE Deficits /  Details: R elbow ace wrapped and in sling; noted pt moving shoulder and able to move digits through full ROM RUE: Unable to fully assess due to pain;Unable to fully assess due to immobilization RUE Coordination: decreased gross motor    Lower Extremity Assessment Lower Extremity Assessment: Generalized weakness;RLE deficits/detail RLE Deficits / Details: reports soreness at R hip; reduced weight shift and weight bearing on this side       Communication   Communication: No difficulties  Cognition Arousal/Alertness: Awake/alert Behavior During Therapy: WFL for tasks assessed/performed Overall Cognitive Status: History of cognitive impairments - at baseline                                 General Comments: pt pleasantly confused throughout session; often repeating statements and looking to her daughter for confirmation of answers      General Comments General comments (skin integrity, edema, etc.): patients daughter present and supportive; daughter to inquire if ALF can provide higher level of care    Exercises Hand Exercises Digit Composite Flexion: AROM;10 reps;Right;Seated Composite Extension: AROM;10 reps;Right;Seated Other Exercises Other Exercises: educated daughter to encourage digit ROM throughout the day    Assessment/Plan    PT Assessment Patient needs continued PT services  PT Problem List Decreased strength;Decreased activity tolerance;Decreased balance;Decreased mobility;Decreased knowledge of use of DME;Decreased safety awareness       PT Treatment Interventions DME instruction;Gait training;Functional mobility training;Therapeutic activities;Therapeutic exercise;Balance training;Patient/family education    PT Goals (Current goals can be found in the Care Plan section)  Acute Rehab PT Goals Patient Stated Goal: get back to moving PT Goal Formulation: With patient/family Time For Goal Achievement: 10/17/18 Potential to Achieve Goals: Good     Frequency Min 3X/week   Barriers to discharge        Co-evaluation PT/OT/SLP Co-Evaluation/Treatment: Yes Reason for Co-Treatment: For patient/therapist safety;To address functional/ADL transfers;Necessary to address cognition/behavior during functional activity PT goals addressed during session: Mobility/safety with mobility;Balance OT goals addressed during session: Strengthening/ROM;ADL's and self-care       AM-PAC PT "6 Clicks" Mobility  Outcome Measure Help needed turning from your back to your side while in a flat bed without using bedrails?: A Little Help needed moving from lying on your back to sitting on the side of a flat bed without using bedrails?: A Little Help needed moving to and from a bed to a chair (including a wheelchair)?: A Little Help needed standing up from a chair using your arms (e.g., wheelchair or bedside chair)?: A Little Help needed to walk in hospital room?: A Lot Help needed climbing 3-5 steps with a railing? : A Lot 6 Click Score: 16    End of Session Equipment Utilized During Treatment: Gait belt(R UE sling) Activity Tolerance: Patient tolerated treatment well Patient left: in chair;with call bell/phone within reach;with chair alarm set;with family/visitor present Nurse Communication: Mobility status PT Visit Diagnosis: Unsteadiness on feet (R26.81);Other abnormalities of gait and mobility (R26.89);Muscle weakness (generalized) (M62.81);History of falling (Z91.81)    Time: 1610-9604 PT Time Calculation (min) (ACUTE ONLY): 26 min   Charges:   PT Evaluation $PT Eval Moderate Complexity: 1 Mod         Kipp Laurence, PT, DPT Supplemental Physical Therapist 10/03/18 12:54 PM Pager: [phone] Office: [phone]

## 2018-10-03 NOTE — Evaluation (Signed)
Occupational Therapy Evaluation Patient Details Name: Mia Rogers MRN: [id number] DOB: 1927-10-30 Today's Date: 10/03/2018    History of Present Illness Pt is an 84 y/o female with PMHx including Dementia, Essential tremor, and Hypothyroid. Pt presenting after a mechanical fall sustaning maxillary fractures and R olecranon fracture. Pt is now s/p ORIF R olecranon fracture.    Clinical Impression   This 84 y/o female presents with the above. Pt pleasantly confused throughout session with daughter present and assisting to provide PLOF. PTA, pt was independent with ADLs and functional mobility, residing in ALF. Pt currently requires overall minA+2 (use of HHA) for transfer to recliner this session. She currently requires modA for UB ADL, maxA for LB ADLs. Pt's daughter reports will look into level of care ALF is able to provide at time of discharge. Recommend pt have 24hr supervision/assist at time of discharge and follow up therapy services, but feel pt will benefit from returning to her familiar environment given baseline cognitive deficits. Will continue to follow and assess for appropriate discharge needs as well as to maximize pt's overall safety and independence with ADLs and mobility.     Follow Up Recommendations  Supervision/Assistance - 24 hour;Home health OT(if ALF able to provde level of assist and therapies)    Equipment Recommendations  3 in 1 bedside commode           Precautions / Restrictions Precautions Precautions: Fall Required Braces or Orthoses: Sling Restrictions Weight Bearing Restrictions: Yes RUE Weight Bearing: Non weight bearing      Mobility Bed Mobility Overal bed mobility: Needs Assistance Bed Mobility: Supine to Sit     Supine to sit: Min assist     General bed mobility comments: pt transitioning into long sitting without assist; requires assist for RLE and to scoot hips towards EOB  Transfers Overall transfer level: Needs  assistance Equipment used: 1 person hand held assist Transfers: Sit to/from [employer] Sit to Stand: Min assist;+2 physical assistance;+2 safety/equipment Stand pivot transfers: Min assist;+2 physical assistance;+2 safety/equipment;Mod assist       General transfer comment: assist to rise and steady in standing; HHA utilized for LUE; pt requires +2 steadying assist to ambulate few steps in room towards recliner; heavy reliance on LUE/external support when moving LLE forward    Balance Overall balance assessment: Needs assistance Sitting-balance support: Feet supported Sitting balance-Leahy Scale: Good     Standing balance support: Single extremity supported Standing balance-Leahy Scale: Poor Standing balance comment: reliant on UE support/external support                           ADL either performed or assessed with clinical judgement   ADL Overall ADL's : Needs assistance/impaired Eating/Feeding: Set up;Sitting   Grooming: Set up;Minimal assistance;Sitting   Upper Body Bathing: Minimal assistance;Sitting   Lower Body Bathing: +2 for physical assistance;+2 for safety/equipment;Sit to/from stand;Moderate assistance   Upper Body Dressing : Moderate assistance;Sitting   Lower Body Dressing: Maximal assistance;+2 for physical assistance;+2 for safety/equipment;Sit to/from stand       Toileting- Architect and Hygiene: Maximal assistance;+2 for physical assistance;+2 for safety/equipment;Sit to/from stand       Functional mobility during ADLs: Minimal assistance;Moderate assistance;+2 for physical assistance;+2 for safety/equipment(HHA)       Vision         Perception     Praxis      Pertinent Vitals/Pain Pain Assessment: Faces Faces Pain Scale: Hurts little more Pain  Location: R elbow; R hip Pain Descriptors / Indicators: Discomfort;Guarding;Sore Pain Intervention(s): Limited activity within patient's tolerance;Monitored  during session;Repositioned     Hand Dominance Right   Extremity/Trunk Assessment Upper Extremity Assessment Upper Extremity Assessment: RUE deficits/detail RUE Deficits / Details: R elbow ace wrapped and in sling; noted pt moving shoulder and able to move digits through full ROM RUE: Unable to fully assess due to pain;Unable to fully assess due to immobilization RUE Coordination: decreased gross motor   Lower Extremity Assessment Lower Extremity Assessment: Defer to PT evaluation       Communication Communication Communication: No difficulties   Cognition Arousal/Alertness: Awake/alert Behavior During Therapy: WFL for tasks assessed/performed Overall Cognitive Status: History of cognitive impairments - at baseline                                 General Comments: pt pleasantly confused throughout session; often repeating statements and looking to her daughter for confirmation of answers   General Comments  pt's daughter present during session, daughter verbalizes understanding of need for 24hr supervision/assist initially, plans to look into level of care ALF is able to provide at time of discharge     Exercises Exercises: Hand exercises;Other exercises Hand Exercises Digit Composite Flexion: AROM;10 reps;Right;Seated Composite Extension: AROM;10 reps;Right;Seated Other Exercises Other Exercises: educated daughter to encourage digit ROM throughout the day    Shoulder Instructions      Home Living Family/patient expects to be discharged to:: Assisted living                                 Additional Comments: [facility] - does not ambulate with assistance; facility provides meals; does not require supervision for bathing/dressing      Prior Functioning/Environment Level of Independence: Independent                 OT Problem List: Decreased range of motion;Decreased strength;Decreased activity tolerance;Impaired  balance (sitting and/or standing);Decreased cognition;Decreased safety awareness;Impaired UE functional use;Pain      OT Treatment/Interventions: Self-care/ADL training;Therapeutic exercise;DME and/or AE instruction;Therapeutic activities;Patient/family education;Balance training    OT Goals(Current goals can be found in the care plan section) Acute Rehab OT Goals Patient Stated Goal: get back to moving OT Goal Formulation: With patient Time For Goal Achievement: 10/17/18 Potential to Achieve Goals: Good  OT Frequency: Min 2X/week   Barriers to D/C:            Co-evaluation PT/OT/SLP Co-Evaluation/Treatment: Yes Reason for Co-Treatment: For patient/therapist safety;To address functional/ADL transfers;Necessary to address cognition/behavior during functional activity   OT goals addressed during session: Strengthening/ROM;ADL's and self-care      AM-PAC OT "6 Clicks" Daily Activity     Outcome Measure Help from another person eating meals?: A Little Help from another person taking care of personal grooming?: A Little Help from another person toileting, which includes using toliet, bedpan, or urinal?: A Lot Help from another person bathing (including washing, rinsing, drying)?: A Lot Help from another person to put on and taking off regular upper body clothing?: A Lot Help from another person to put on and taking off regular lower body clothing?: A Lot 6 Click Score: 14   End of Session Equipment Utilized During Treatment: Gait belt;Other (comment)(sling) Nurse Communication: Mobility status  Activity Tolerance: Patient tolerated treatment well Patient left: in chair;with call bell/phone within reach;with chair  alarm set;with family/visitor present  OT Visit Diagnosis: Unsteadiness on feet (R26.81);Muscle weakness (generalized) (M62.81);Pain Pain - Right/Left: Right Pain - part of body: Arm;Hip                Time: 1610-9604 OT Time Calculation (min): 26 min Charges:  OT  General Charges $OT Visit: 1 Visit OT Evaluation $OT Eval Moderate Complexity: 1 Mod  Marcy Siren, OT [employer] Pager [phone] Office [phone]   Orlando Penner 10/03/2018, 12:42 PM

## 2018-10-03 NOTE — Progress Notes (Signed)
0150: Pt was restless, confused, pulling out surgical dressing and getting out of the bed. On call MD made aware and ordered to give patient haldol 2 mg IV. Pt did calm a little bit. Daughter at the bedside informing RN that patient is dozing off and on.  0320: Pt complained of pain on his arm. Verbalized "my right thumb hurts". 1 tablet hydrocodone 5-325 mg p.o has given. Pt resting comfortably on bed as of now. Will continue to monitor.

## 2018-10-03 NOTE — Discharge Summary (Addendum)
Family Medicine Teaching [facility] Discharge Summary  Patient name: Mia Rogers Medical record number: [id number] Date of birth: [birth year] Age: 84 y.o. Gender: female Date of Admission: 10/01/2018  Date of Discharge: 10/06/2018 Admitting Physician: Latrelle Dodrill, MD  Primary Care Provider: Manus Gunning, FNP Consultants: ENT, Ortho  Indication for Hospitalization: olecranon and maxillary fractures s/p mechanical fall  Discharge Diagnoses/Problem List:  Olecranon fracture Maxillary fracture AKI Hypothyroidism Dementia Essential tremor  Disposition: SNF  Discharge Condition: improved, stable  Discharge Exam:  BP 118/80 (BP Location: Left Arm)   Pulse 71   Temp 98.8 F (37.1 C) (Oral)   Resp 14   Ht 5\' 7"  (1.702 m)   Wt 59 kg   SpO2 96%   BMI 20.36 kg/m   Physical Exam:  Gen: NAD, sitting up in chair, alert and oriented.    HEENT: Normocephaic, atraumatic. Clear conjuctiva, no scleral icterus and injection.  CV:  Regular rate and rhythm,   Radial pulses 2+ bilaterally. No bilateral lower extremity edema. Resp: Clear to auscultation bilaterally.  No wheezing, rales, abnormal lung sounds.  No increased work of breathing appreciated. Abd: Nontender and nondistended on palpation to all 4 quadrants.  Positive bowel sounds. Psych: Cooperative with exam. Pleasant. Makes eye contact.  Brief Hospital Course:  Mia Rogers is a 84 y.o. female presenting after a mechanical fall with maxillary fractures and olecranon fracture. Marland Kitchen PMH is significant for hypothyroid, dementia. In the ED her facial laceration was sutured, orthopedics and ENT were consulted.  The next day successful ORIF was performed on olecranon fracture. ENT recommended no surgical intervention for maxillary fractures.  Patient had an uneventful post op recovery, other than some increased confusion and drowsiness in the day following surgery. This resolved as patient's oxycodone regimen decreased. On  the morning of discharge patient was difficult to awake, but on recheck  2 hours later she was awake and sitting in her chair. Patient worked with PT throughout her recovery.    Patient had some chest pain that was most likely acid reflux.  Troponin and EKG were negative.  Patient was started on famotidine and given prn maalox.   Hypothyroidism and dementia were controlled with home medications.     On discharge the patient was improved, able to participate in PT, and mentally back to her baseline.   Issues for Follow Up:  1. Essential tremors - propranolol is dosed as a short acting once a day medication.  Consider making twice a day or making long acting formulation for better coverage of tremors.  2. Ortho - patient needs follow up with orthopedic surgeon, Dr. Eulah Pont, within 10 days of discharge.  3. ENT - patient does not need f/u appointment for maxillary fractures per ENT 4. Acid reflux - started on famotidine after chest pain in the hospital.  Reassess patient's symptoms and if it is controlled on this medication.  5. Loss of appetite.  Patient complains of loss of appetite over the past week or two.  Her appetite was decreased during her admission.  Consider adding nutritional supplements to her meals.     Significant Procedures: ORIF on R olecranon.   Significant Labs and Imaging:  Recent Labs  Lab 10/03/18 0744 10/04/18 0650 10/06/18 1031  WBC 9.8 7.2 6.9  HGB 10.4* 11.7* 11.9*  HCT 32.4* 37.6 37.8  PLT 217 244 266   Recent Labs  Lab 10/01/18 0950 10/02/18 0514 10/03/18 0744 10/04/18 0650 10/06/18 1031  NA 140 137 131* 133* 133*  K 3.9 3.6 3.3* 4.7 3.7  CL 106 102 95* 98 97*  CO2 24 24 24 26 23   GLUCOSE 109* 112* 126* 100* 110*  BUN 9 18 13 11 12   CREATININE 1.05* 1.35* 0.79 0.86 0.88  CALCIUM 9.3 8.6* 8.2* 8.8* 8.4*  ALKPHOS  --   --   --   --  46  AST  --   --   --   --  28  ALT  --   --   --   --  12  ALBUMIN  --   --   --   --  2.8*     Results/Tests  Pending at Time of Discharge: none  Discharge Medications:  Allergies as of 10/06/2018   No Known Allergies     Medication List    TAKE these medications   acetaminophen 500 MG tablet Commonly known as:  TYLENOL Take 2 tablets (1,000 mg total) by mouth 3 (three) times daily.   DAILY VITE Tabs Take 1 tablet by mouth daily.   donepezil 10 MG tablet Commonly known as:  ARICEPT Take 10 mg by mouth at bedtime.   ibuprofen 200 MG tablet Commonly known as:  ADVIL,MOTRIN Take 400 mg by mouth every 8 (eight) hours as needed (pain).   levothyroxine 50 MCG tablet Commonly known as:  SYNTHROID, LEVOTHROID Take 50 mcg by mouth See admin instructions. Take one tablet (50 mcg) by mouth Sunday, Monday, Tuesday, Thursday, Friday Saturday at 6am (skip Wednesday)   propranolol 40 MG tablet Commonly known as:  INDERAL Take 40 mg by mouth daily at 6 (six) AM.   QUEtiapine 25 MG tablet Commonly known as:  SEROQUEL Take 50 mg by mouth at bedtime.       Discharge Instructions: Please refer to Patient Instructions section of EMR for full details.  Patient was counseled important signs and symptoms that should prompt return to medical care, changes in medications, dietary instructions, activity restrictions, and follow up appointments.   Follow-Up Appointments:  Contact information for follow-up providers    Sheral ApleyMurphy, Timothy D, MD. Schedule an appointment as soon as possible for a visit in 10 day(s).   Specialty:  Orthopedic Surgery Contact information: [address] [place] [address] [phone]            Contact information for after-discharge care    Destination    [facility] .   Service:  Skilled Nursing Contact information: [address] [place] [id number] [phone]                  Sandre Kittylson, Daniel K, MD 10/06/2018, 2:06 PM PGY-1, [facility]

## 2018-10-04 ENCOUNTER — Encounter (HOSPITAL_COMMUNITY): Payer: Self-pay | Admitting: Orthopedic Surgery

## 2018-10-04 LAB — CBC WITH DIFFERENTIAL/PLATELET
Abs Immature Granulocytes: 0.02 10*3/uL (ref 0.00–0.07)
Basophils Absolute: 0 10*3/uL (ref 0.0–0.1)
Basophils Relative: 0 %
Eosinophils Absolute: 0.3 10*3/uL (ref 0.0–0.5)
Eosinophils Relative: 5 %
HCT: 37.6 % (ref 36.0–46.0)
HEMOGLOBIN: 11.7 g/dL — AB (ref 12.0–15.0)
Immature Granulocytes: 0 %
LYMPHS PCT: 19 %
Lymphs Abs: 1.4 10*3/uL (ref 0.7–4.0)
MCH: 28.1 pg (ref 26.0–34.0)
MCHC: 31.1 g/dL (ref 30.0–36.0)
MCV: 90.2 fL (ref 80.0–100.0)
Monocytes Absolute: 0.5 10*3/uL (ref 0.1–1.0)
Monocytes Relative: 7 %
Neutro Abs: 4.9 10*3/uL (ref 1.7–7.7)
Neutrophils Relative %: 69 %
Platelets: 244 10*3/uL (ref 150–400)
RBC: 4.17 MIL/uL (ref 3.87–5.11)
RDW: 14 % (ref 11.5–15.5)
WBC: 7.2 10*3/uL (ref 4.0–10.5)
nRBC: 0 % (ref 0.0–0.2)

## 2018-10-04 LAB — BASIC METABOLIC PANEL
Anion gap: 9 (ref 5–15)
BUN: 11 mg/dL (ref 8–23)
CHLORIDE: 98 mmol/L (ref 98–111)
CO2: 26 mmol/L (ref 22–32)
Calcium: 8.8 mg/dL — ABNORMAL LOW (ref 8.9–10.3)
Creatinine, Ser: 0.86 mg/dL (ref 0.44–1.00)
GFR calc Af Amer: >60 mL/min (ref >60)
GFR calc non Af Amer: >60 mL/min (ref >60)
Glucose, Bld: 100 mg/dL — ABNORMAL HIGH (ref 70–99)
POTASSIUM: 4.7 mmol/L (ref 3.5–5.1)
Sodium: 133 mmol/L — ABNORMAL LOW (ref 135–145)

## 2018-10-04 LAB — TROPONIN I: Troponin I: <0.03 ng/mL (ref <0.03)

## 2018-10-04 MED ORDER — OXYCODONE HCL 5 MG PO TABS: 5.0000 mg | ORAL_TABLET | Freq: Four times a day (QID) | ORAL | Status: DC | PRN

## 2018-10-04 NOTE — Progress Notes (Signed)
Physical Therapy Treatment Patient Details Name: Mia ReddenBonnie Rogers MRN: [id number] DOB: 05-23-1928 Today's Date: 10/04/2018    History of Present Illness Pt is an 84 y/o female with PMHx including Dementia, Essential tremor, and Hypothyroid. Pt presenting after a mechanical fall sustaning maxillary fractures and R olecranon fracture. Pt is now s/p ORIF R olecranon fracture.     PT Comments    Patient seen for mobility progression. Pt requires more assistance today than previously noted session and appears withdrawn with minimal communication. Pt requires mod/max A (+2 for safety) for bed mobility, functional transfers, and short distance gait training. Pt with shuffling gait and difficulty clearing feet from floor. Pt will need 24 hour assistance upon d/c. Continue to progress as tolerated.   Follow Up Recommendations  Home health PT;Supervision/Assistance - 24 hour(pending if [facility] can provide 24/hr care and therapies)     Equipment Recommendations  Other (comment)(defer)    Recommendations for Other Services OT consult(as ordered)     Precautions / Restrictions Precautions Precautions: Fall Required Braces or Orthoses: Sling Restrictions Weight Bearing Restrictions: Yes RUE Weight Bearing: Non weight bearing    Mobility  Bed Mobility Overal bed mobility: Needs Assistance Bed Mobility: Supine to Sit     Supine to sit: Mod assist;HOB elevated;+2 for safety/equipment     General bed mobility comments: assist to bring hips to EOB and to elevate trunk into sitting; cues for sequencing   Transfers Overall transfer level: Needs assistance Equipment used: 1 person hand held assist(and assist at trunk with use of gait belt) Transfers: Sit to/from [employer]Stand;Stand Pivot Transfers Sit to Stand: +2 physical assistance;Mod assist Stand pivot transfers: Mod assist;+2 safety/equipment;+2 physical assistance       General transfer comment: assist to power up into standing and for balance  upon standing and when pivoting ot [facility]  Ambulation/Gait Ambulation/Gait assistance: +2 safety/equipment;Mod assist;Max assist Gait Distance (Feet): 6 Feet Assistive device: 1 person hand held assist(assist at trunk with use of gait belt) Gait Pattern/deviations: Step-to pattern;Antalgic;Shuffle;Trunk flexed;Decreased step length - right;Decreased step length - left;Decreased stance time - right Gait velocity: decreased   General Gait Details: max cues for sequencing and picking feet from floor; pt "inch worming" feet forward and requires mod/max A to maintain balance; pt took ~4 steps where she was able to clear the floor and does appear to have R painful side but pt unable to state if and/or what was painful   Stairs             Wheelchair Mobility    Modified Rankin (Stroke Patients Only)       Balance Overall balance assessment: Needs assistance Sitting-balance support: Feet supported Sitting balance-Leahy Scale: Good     Standing balance support: Single extremity supported Standing balance-Leahy Scale: Zero                              Cognition Arousal/Alertness: Awake/alert Behavior During Therapy: WFL for tasks assessed/performed Overall Cognitive Status: History of cognitive impairments - at baseline                                 General Comments: pt speaking minimally during session      Exercises      General Comments        Pertinent Vitals/Pain Pain Assessment: Faces Faces Pain Scale: Hurts little more Pain Location: R elbow Pain Descriptors /  Indicators: Guarding Pain Intervention(s): Limited activity within patient's tolerance;Monitored during session;Repositioned    Home Living                      Prior Function            PT Goals (current goals can now be found in the care plan section) Progress towards PT goals: Not progressing toward goals - comment    Frequency    Min 3X/week       PT Plan Current plan remains appropriate    Co-evaluation              AM-PAC PT "6 Clicks" Mobility   Outcome Measure  Help needed turning from your back to your side while in a flat bed without using bedrails?: A Lot Help needed moving from lying on your back to sitting on the side of a flat bed without using bedrails?: A Lot Help needed moving to and from a bed to a chair (including a wheelchair)?: A Lot Help needed standing up from a chair using your arms (e.g., wheelchair or bedside chair)?: A Lot Help needed to walk in hospital room?: A Lot Help needed climbing 3-5 steps with a railing? : Total 6 Click Score: 11    End of Session Equipment Utilized During Treatment: Gait belt(R UE sling) Activity Tolerance: Patient tolerated treatment well Patient left: in chair;with call bell/phone within reach;with chair alarm set Nurse Communication: Mobility status PT Visit Diagnosis: Unsteadiness on feet (R26.81);Other abnormalities of gait and mobility (R26.89);Muscle weakness (generalized) (M62.81);History of falling (Z91.81)     Time: 2595-6387 PT Time Calculation (min) (ACUTE ONLY): 25 min  Charges:  $Gait Training: 8-22 mins $Therapeutic Activity: 8-22 mins                     Erline Levine, PTA Acute Rehabilitation Services Pager: [phone] Office: [phone]     Carolynne Edouard 10/04/2018, 4:25 PM

## 2018-10-04 NOTE — Progress Notes (Signed)
This RN paged family medicine intern regarding pt's complaint of chest and back pain this am. Manson PasseyBrown emesis 100mL. Maalox given. Pt stated relief. Marcial Pacasimothy, MD returned page. EKG to be ordered. Pt resting in bed comfortably now. Will continue to monitor.

## 2018-10-04 NOTE — Progress Notes (Signed)
Family Medicine Teaching Service Daily Progress Note Intern Pager: [phone]  Patient name: Mia Rogers Medical record number: [id number] Date of birth: 1928/07/19 Age: 84 y.o. Gender: female  Primary Care Provider: Manus GunningMazurek, Maggie, FNP Consultants: ortho Code Status: Full code  Pt Overview and Major Events to Date:  Hospital Day 3 Admitted: 10/01/2018  Assessment and Plan: Mia BasqueBonnie Donovanis a 84 y.o.femalepresenting after a mechanical fall with maxillary fractures and olecranon fracture.Marland Kitchen. PMH is significant forhypothyroidism, dementia.   R maxilla and olecranon fractures 2/2 mechanical fall, s/p ORIF  POD 2 after ORIF of R olecranon fracture.  Goals include pain control and PT/OT.  No intervention from ENT standpoint.  No other bony injuries utilized on other x-rays of right side. She received oxy at 4pm and 8pm. This morning patient still ahs some pain on the right side.   -Ortho on board, appreciate recs -Pain control: Tylenol 1,000 mg TID, oxy 5mg   q4h PRN moderate pain,  -SCDs - PT/OT following surgery  AKI: Resolved CR 0.79 on 12/8 after rising to 1.35 on 12/7 after being 1.05 on admit.   - Monitor fluid/p.o. intake   Hypothyroidism: Chronic, stable. Follows with PCP for TSH regularly.  - continue home Synthroid 50 mcg   Dementia: Chronic, stable.   Her son is HCPOA.  Patient had an episode of agitation, likely due to delirium, on night of 12/7 and could not be redirected, so she received Haldol 2 mg once.  She denied pain at that time but did receive pain medication about 1 hour afterward per her request. Has not had agitation/delirium since.  - Continue home aricept  - Continue home seroquel -Continue to reorient and will discharge as soon as it is medically safe  Essentialtremor: Chronic, stable.   Tremor of head, neck, and arms visible on admit. -Continue home propranolol  FEN/GI: Heart healthy diet Prophylaxis: SCDs  Disposition: SNF.  Her  assisted living facility ?   Medications: Scheduled Meds: . acetaminophen  1,000 mg Oral TID  . donepezil  10 mg Oral QHS  . levothyroxine  50 mcg Oral Once per day on Sun Mon Tue Thu Fri Sat  . polyethylene glycol  17 g Oral Daily  . propranolol  40 mg Oral Q0600  . QUEtiapine  50 mg Oral QHS  . senna  1 tablet Oral Daily   Continuous Infusions: . lactated ringers    . methocarbamol (ROBAXIN) IV     PRN Meds: alum & mag hydroxide-simeth, methocarbamol **OR** methocarbamol (ROBAXIN) IV, metoCLOPramide **OR** metoCLOPramide (REGLAN) injection, ondansetron **OR** ondansetron (ZOFRAN) IV, oxyCODONE  ================================================= ================================================= Subjective:  Patient had one episode of vomiting this morning after drinking water.  She also complains of chest pain and a 'burning' in her throat.  She also complains of back pain, midline between her shoulder blades.  Patient slept from 8pm to 4am last night.    Objective: Temp:  [97.3 F (36.3 C)-98.2 F (36.8 C)] 97.3 F (36.3 C) (12/09 0408) Pulse Rate:  [59-64] 64 (12/09 0408) Resp:  [18] 18 (12/08 1600) BP: (154-161)/(102-106) 156/103 (12/09 0408) SpO2:  [97 %-98 %] 98 % (12/09 0408) Intake/Output No intake/output data recorded. Physical Exam:  Gen: NAD, alert, non-toxic, well-nourished, well-appearing, sitting comfortably  HEENT: Normocephaic, atraumatic. Clear conjuctiva, no scleral icterus and injection. There was a small bit of vomit visible in her emesis bag that appeared brown.   CV: tender to palpation on left chest wall. Regular rate and rhythm, borderline tachycardic. Marland Kitchen.  Normal S1-S2.  Normal capillary refill bilaterally.  Radial pulses 2+ bilaterally. No bilateral lower extremity edema. Resp: Clear to auscultation bilaterally.  No wheezing, rales, abnormal lung sounds.  No increased work of breathing appreciated. Abd: Nontender and nondistended on palpation to all 4  quadrants.  Positive bowel sounds. Psych: Cooperative with exam. Pleasant. Makes eye contact. Extremities: right calf tenderness. No popliteal tenderness to palpation.    Laboratory: Recent Labs  Lab 10/01/18 0950 10/02/18 0514 10/03/18 0744  WBC 7.5 6.7 9.8  HGB 12.7 11.5* 10.4*  HCT 40.9 36.5 32.4*  PLT 296 259 217   Recent Labs  Lab 10/01/18 0950 10/02/18 0514 10/03/18 0744  NA 140 137 131*  K 3.9 3.6 3.3*  CL 106 102 95*  CO2 24 24 24   BUN 9 18 13   CREATININE 1.05* 1.35* 0.79  CALCIUM 9.3 8.6* 8.2*  GLUCOSE 109* 112* 126*    Imaging/Diagnostic Tests: No results found.    Sandre Kitty, MD 10/04/2018, 6:03 AM PGY-1, [facility] Family Medicine FPTS Intern pager: [phone], text pages welcome

## 2018-10-04 NOTE — Plan of Care (Signed)
Problem: Education: Goal: Knowledge of General Education information will improve Description Including pain rating scale, medication(s)/side effects and non-pharmacologic comfort measures Outcome: Progressing   Problem: Health Behavior/Discharge Planning: Goal: Ability to manage health-related needs will improve Outcome: Progressing   Problem: Clinical Measurements: Goal: Respiratory complications will improve Outcome: Progressing   Problem: Coping: Goal: Level of anxiety will decrease Outcome: Progressing   Problem: Elimination: Goal: Will not experience complications related to urinary retention Outcome: Progressing   Problem: Pain Managment: Goal: General experience of comfort will improve Outcome: Progressing   Problem: Safety: Goal: Ability to remain free from injury will improve Outcome: Progressing   Problem: Skin Integrity: Goal: Risk for impaired skin integrity will decrease Outcome: Progressing   

## 2018-10-05 MED ORDER — OXYCODONE HCL 5 MG PO TABS: 2.5000 mg | ORAL_TABLET | Freq: Four times a day (QID) | ORAL | Status: DC | PRN

## 2018-10-05 MED ORDER — PROPRANOLOL HCL 20 MG PO TABS
40.0000 mg | ORAL_TABLET | Freq: Every day | ORAL | Status: DC
  Administered 2018-10-06: 40 mg via ORAL
  Filled 2018-10-05: qty 2

## 2018-10-05 MED ORDER — FAMOTIDINE 20 MG PO TABS
20.0000 mg | ORAL_TABLET | Freq: Every day | ORAL | Status: DC
  Administered 2018-10-05 – 2018-10-06 (×2): 20 mg via ORAL
  Filled 2018-10-05 (×2): qty 1

## 2018-10-05 MED ORDER — MENTHOL 3 MG MT LOZG
1.0000 | LOZENGE | OROMUCOSAL | Status: DC | PRN
  Administered 2018-10-05: 3 mg via ORAL

## 2018-10-05 NOTE — [place] FL2 (Signed)
[place] MEDICAID FL2 LEVEL OF CARE SCREENING TOOL     IDENTIFICATION  Patient Name: Mia ReddenBonnie Rogers Birthdate: 08/16/1928 Sex: female Admission Date (Current Location): 10/01/2018  [facility] [place]   Facility and Address:  [facility], [address], [place], [place] [address]      Provider Number: [id number]  Attending Physician Name and Address:  Latrelle DodrillMcIntyre, Brittany J, MD  Relative Name and Phone Number:  Lorin [facility]Scott [facility]POA and son) [phone]    Current Level of Care: Hospital Recommended Level of Care: Skilled Nursing Facility Prior Approval Number:    Date Approved/Denied: 10/05/18 PASRR Number: [id number] [id number]  Discharge Plan: SNF    Current Diagnoses: Patient Active Problem List   Diagnosis Date Noted  . Fall 10/01/2018  . Closed fracture of right olecranon process     Orientation RESPIRATION BLADDER Height & Weight     Self, Situation  Normal Incontinent, External catheter Weight: 59 kg Height:  5\' 7"  (170.2 cm)  BEHAVIORAL SYMPTOMS/MOOD NEUROLOGICAL BOWEL NUTRITION STATUS      Continent Diet(see discharge summary)  AMBULATORY STATUS COMMUNICATION OF NEEDS Skin   Extensive Assist Verbally Surgical wounds(ecchymosis right face, right arm closed surgical incision, right eye open wound/laceration)                       Personal Care Assistance Level of Assistance  Bathing, Dressing, Total care, Feeding Bathing Assistance: Maximum assistance Feeding assistance: Independent Dressing Assistance: Maximum assistance Total Care Assistance: Maximum assistance   Functional Limitations Info  Sight, Hearing, Speech Sight Info: Impaired(right eye edema/trauma) Hearing Info: Adequate Speech Info: Adequate    SPECIAL CARE FACTORS FREQUENCY  PT (By licensed PT), OT (By licensed OT)     PT Frequency: min 5x weekly OT Frequency: min 5x weekly            Contractures Contractures Info: Not present    Additional  Factors Info  Allergies, Code Status Code Status Info: full  Allergies Info: no known           Current Medications (10/05/2018):  This is the current hospital active medication list Current Facility-Administered Medications  Medication Dose Route Frequency Provider Last Rate Last Dose  . acetaminophen (TYLENOL) tablet 1,000 mg  1,000 mg Oral TID Albina BilletMartensen, Henry Calvin III, PA-C   1,000 mg at 10/05/18 [id number]  . alum & mag hydroxide-simeth (MAALOX/MYLANTA) 200-200-20 MG/5ML suspension 30 mL  30 mL Oral Q6H PRN Latrelle DodrillMcIntyre, Brittany J, MD   30 mL at 10/05/18 0924  . donepezil (ARICEPT) tablet 10 mg  10 mg Oral QHS Albina BilletMartensen, Henry Calvin III, PA-C   10 mg at 10/04/18 2133  . levothyroxine (SYNTHROID, LEVOTHROID) tablet 50 mcg  50 mcg Oral Once per day on Sun Mon Tue Thu Fri Sat Albina BilletMartensen, Henry Calvin III, PA-C   50 mcg at 10/05/18 [id number]  . methocarbamol (ROBAXIN) tablet 500 mg  500 mg Oral Q6H PRN Albina BilletMartensen, Henry Calvin III, PA-C   500 mg at 10/03/18 1952  . metoCLOPramide (REGLAN) tablet 5-10 mg  5-10 mg Oral Q8H PRN Albina BilletMartensen, Henry Calvin III, PA-C   5 mg at 10/05/18 0701   Or  . metoCLOPramide (REGLAN) injection 5-10 mg  5-10 mg Intravenous Q8H PRN Albina BilletMartensen, Henry Calvin III, PA-C      . ondansetron [facility](ZOFRAN) tablet 4 mg  4 mg Oral Q6H PRN Albina BilletMartensen, Henry Calvin III, PA-C       Or  . ondansetron [facility](ZOFRAN) injection  4 mg  4 mg Intravenous Q6H PRN Albina Billet III, PA-C   4 mg at 10/04/18 0855  . oxyCODONE (Oxy IR/ROXICODONE) immediate release tablet 2.5 mg  2.5 mg Oral Q6H PRN Lockamy, Timothy, DO      . polyethylene glycol (MIRALAX / GLYCOLAX) packet 17 g  17 g Oral Daily Albina Billet III, PA-C   17 g at 10/05/18 1610  . propranolol (INDERAL) tablet 40 mg  40 mg Oral Q0600 Albina Billet III, PA-C   40 mg at 10/05/18 0548  . QUEtiapine (SEROQUEL) tablet 50 mg  50 mg Oral QHS Albina Billet III, PA-C   50 mg at 10/04/18 2130  . senna (SENOKOT) tablet  8.6 mg  1 tablet Oral Daily Albina Billet III, PA-C   8.6 mg at 10/04/18 9604     Discharge Medications: Please see discharge summary for a list of discharge medications.  Relevant Imaging Results:  Relevant Lab Results:   Additional Information SSN: [id number]  Gildardo Griffes, LCSW

## 2018-10-05 NOTE — Care Management Important Message (Signed)
Important Message  Patient Details  Name: Mia ReddenBonnie Rogers MRN: [id number] Date of Birth: 06/17/1928   Medicare Important Message Given:  Yes    [name] 10/05/2018, 3:33 PM

## 2018-10-05 NOTE — Progress Notes (Signed)
Occupational Therapy Treatment Patient Details Name: Mia Rogers MRN: [id number] DOB: April 20, 1928 Today's Date: 10/05/2018    History of present illness Pt is an 84 y/o female with PMHx including Dementia, Essential tremor, and Hypothyroid. Pt presenting after a mechanical fall sustaning maxillary fractures and R olecranon fracture. Pt is now s/p ORIF R olecranon fracture.    OT comments  Pt pleasant and agreeable to therapy. Pt performing hand hygiene while seated at EOB with supervision and set up. Pt participating in RUE exercises while sitting at EOB. Challenging balance and activity tolerance through pt performing leisure activity (dancing to jazz music) with Min A for balance and safety. Continue to recommend dc with follow up OT and 24/7 support. Will continue to follow acutely as admitted.    Follow Up Recommendations  Supervision/Assistance - 24 hour;Home health OT(if ALF able to provde level of assist and therapies)    Equipment Recommendations  3 in 1 bedside commode    Recommendations for Other Services      Precautions / Restrictions Precautions Precautions: Fall Required Braces or Orthoses: Sling Restrictions Weight Bearing Restrictions: Yes RUE Weight Bearing: Non weight bearing       Mobility Bed Mobility Overal bed mobility: Needs Assistance Bed Mobility: Supine to Sit     Supine to sit: HOB elevated;Min guard     General bed mobility comments: Min Guard A for safety  Transfers Overall transfer level: Needs assistance Equipment used: 1 person hand held assist(and assist at trunk with use of gait belt) Transfers: Sit to/from [employer] Sit to Stand: Min assist Stand pivot transfers: Min assist       General transfer comment: Min A for safety and balance.     Balance Overall balance assessment: Needs assistance Sitting-balance support: Feet supported Sitting balance-Leahy Scale: Good     Standing balance support: Single  extremity supported Standing balance-Leahy Scale: Poor Standing balance comment: Requiring physical A                           ADL either performed or assessed with clinical judgement   ADL Overall ADL's : Needs assistance/impaired     Grooming: Set up;Wash/dry hands;Sitting;Supervision/safety   Upper Body Bathing: Maximal assistance;Sitting Upper Body Bathing Details (indicate cue type and reason): Max A for donning sling. Educating son of sling management             Toilet Transfer: Minimal assistance;Stand-pivot(single hand held simulated to recliner) Statistician Details (indicate cue type and reason): Min A for balance         Functional mobility during ADLs: Minimal assistance(HHA) General ADL Comments: Pt demonstrating increased activity tolerance. Participating in grooming, exercises, and funcitonal mobility.     Vision       Perception     Praxis      Cognition Arousal/Alertness: Awake/alert Behavior During Therapy: WFL for tasks assessed/performed Overall Cognitive Status: History of cognitive impairments - at baseline                                 General Comments: Very agreeable and enjoyed therapy. repeating information including how she loves to dance and she is the safe age as Kris Hartmann        Exercises Exercises: Hand exercises;Other exercises Hand Exercises Wrist Flexion: AROM;Right;10 reps;Seated Wrist Extension: AROM;Right;10 reps;Seated Digit Composite Flexion: AROM;10 reps;Right;Seated Composite Extension: AROM;10 reps;Right;Seated Other Exercises  Other Exercises: Educating son on edema management and sling positioning Other Exercises: Pt participating in leisure activity to dance to jazz music with Min A for safe balance.   Shoulder Instructions       General Comments Son present throughout.     Pertinent Vitals/ Pain       Pain Assessment: Faces Faces Pain Scale: Hurts a little bit Pain  Location: R elbow Pain Descriptors / Indicators: Guarding Pain Intervention(s): Monitored during session;Limited activity within patient's tolerance;Repositioned;Ice applied  Home Living                                          Prior Functioning/Environment              Frequency  Min 2X/week        Progress Toward Goals  OT Goals(current goals can now be found in the care plan section)  Progress towards OT goals: Progressing toward goals  Acute Rehab OT Goals Patient Stated Goal: get back to moving OT Goal Formulation: With patient Time For Goal Achievement: 10/17/18 Potential to Achieve Goals: Good ADL Goals Pt Will Perform Grooming: with set-up;with supervision;sitting Pt Will Perform Upper Body Bathing: with min assist;sitting Pt Will Perform Upper Body Dressing: with min assist;sitting Pt Will Perform Lower Body Dressing: with min assist;sit to/from stand Pt Will Transfer to Toilet: with min assist;ambulating;bedside commode(over toilet) Pt Will Perform Toileting - Clothing Manipulation and hygiene: with min assist;sit to/from stand Pt/caregiver will Perform Home Exercise Program: Right Upper extremity;With written HEP provided  Plan Discharge plan remains appropriate    Co-evaluation                 AM-PAC OT "6 Clicks" Daily Activity     Outcome Measure   Help from another person eating meals?: A Little Help from another person taking care of personal grooming?: A Little Help from another person toileting, which includes using toliet, bedpan, or urinal?: A Lot Help from another person bathing (including washing, rinsing, drying)?: A Lot Help from another person to put on and taking off regular upper body clothing?: A Lot Help from another person to put on and taking off regular lower body clothing?: A Lot 6 Click Score: 14    End of Session Equipment Utilized During Treatment: Gait belt;Other (comment)(sling)  OT Visit  Diagnosis: Unsteadiness on feet (R26.81);Muscle weakness (generalized) (M62.81);Pain Pain - Right/Left: Right Pain - part of body: Arm;Hip   Activity Tolerance Patient tolerated treatment well   Patient Left in chair;with call bell/phone within reach;with chair alarm set;with family/visitor present   Nurse Communication Mobility status        Time: 1450-1516 OT Time Calculation (min): 26 min  Charges: OT General Charges $OT Visit: 1 Visit OT Treatments $Self Care/Home Management : 8-22 mins $Therapeutic Activity: 8-22 mins  Nichole Neyer MSOT, OTR/L Acute Rehab Pager: [phone] Office: [phone]   Theodoro GristCharis M Domnick Chervenak 10/05/2018, 3:26 PM

## 2018-10-05 NOTE — Progress Notes (Signed)
Pt had previously reported chest pain originating in the epigastric area during the night. In to meet pt at change of shift. Pt denies chest pain at this time.

## 2018-10-05 NOTE — Progress Notes (Signed)
Family Medicine Teaching Service Daily Progress Note Intern Pager: [phone]  Patient name: Mia Rogers Medical record number: [id number] Date of birth: 04-21-1928 Age: 84 y.o. Gender: female  Primary Care Provider: Manus Gunning, FNP Consultants: ortho Code Status: Full code  Pt Overview and Major Events to Date:  Hospital Day 4 Admitted: 10/01/2018  Assessment and Plan: Neya Creegan a 84 y.o.femalepresenting after a mechanical fall with maxillary fractures and olecranon fracture.Marland Kitchen PMH is significant forhypothyroidism, dementia.    R maxilla and olecranon fractures 2/2 mechanical fall, s/p ORIF  POD 2 after ORIF of R olecranon fracture.  Goals include pain control and PT/OT.  No intervention from ENT standpoint.  No other bony injuries utilized on other x-rays of right side. Patient had one dose of oxycodone yesterday morning. This morning patient still has some pain on the right side. She is still having chest pain, but this is most likely reflux related. We decreased her oxy to 2.5 q6h due to confusion.    -Ortho on board, appreciate recs -Pain control: Tylenol 1,000 mg TID, oxy 2.5mg   q4h PRN moderate pain, methocarbamol 500mg  q6h.  -SCDs - PT/OT following surgery  Chest pain  - patient has complained about chest pain the past two mornings.  EKG and troponin was negative.  Pain is most likely reflux based on symptoms and previous history of reflux.   - start famotidine 20mg    AKI: Resolved CR 0.79 on 12/8 after rising to 1.35 on 12/7 after being 1.05 on admit.   - Monitor fluid/p.o. intake   Hypothyroidism: Chronic, stable. Follows with PCP for TSH regularly.  - continue home Synthroid 50 mcg   Dementia: Chronic, stable.   Her son is HCPOA.  Patient had an episode of agitation, likely due to delirium, on night of 12/7 and could not be redirected, so she received Haldol 2 mg once.  patient's confusion has improved each day.   - Continue home aricept  -  Continue home seroquel -Continue to reorient and will discharge as soon as it is medically safe  Essentialtremor: Chronic, stable.   Tremor of head, neck, and arms visible on admit.  -Continue home propranolol  FEN/GI: Heart healthy diet Prophylaxis: SCDs  Disposition: SNF.     Medications: Scheduled Meds: . acetaminophen  1,000 mg Oral TID  . donepezil  10 mg Oral QHS  . levothyroxine  50 mcg Oral Once per day on Sun Mon Tue Thu Fri Sat  . polyethylene glycol  17 g Oral Daily  . propranolol  40 mg Oral Q0600  . QUEtiapine  50 mg Oral QHS  . senna  1 tablet Oral Daily   Continuous Infusions:  PRN Meds: alum & mag hydroxide-simeth, methocarbamol **OR** [DISCONTINUED] methocarbamol (ROBAXIN) IV, metoCLOPramide **OR** metoCLOPramide (REGLAN) injection, ondansetron **OR** ondansetron (ZOFRAN) IV, oxyCODONE  ================================================= ================================================= Subjective:  Patient complained of chest pain this morning that got better after she was given some reglan.  Per her son, the patient is not eating since the surgery and he feels like the oxycodone makes her confused and disoriented.  eh feels she was more 'lucid' as the day progressed yesterday but still very tired.  He says she has stil  Been drinking fluids.  patient states she is having chest pain.      Objective: Temp:  [97.9 F (36.6 C)-98.6 F (37 C)] 97.9 F (36.6 C) (12/10 0439) Pulse Rate:  [59-79] 79 (12/10 0439) Resp:  [14-18] 14 (12/10 0439) BP: (142-175)/(78-98) 146/88 (12/10 0439) SpO2:  [  93 %-95 %] 95 % (12/10 0439) Intake/Output 12/09 0701 - 12/10 0700 In: 240 [P.O.:240] Out: 1625 [Urine:1525; Emesis/NG output:100] Physical Exam:  Gen: NAD, tired, sleeping throughout most of exam.   HEENT: Normocephaic, atraumatic. Clear conjuctiva, no scleral icterus and injection.  CV:  Regular rate and rhythm,  . .  Normal S1-S2.   Normal capillary refill  bilaterally.  Radial pulses 2+ bilaterally. No bilateral lower extremity edema. Resp: Clear to auscultation bilaterally.  No wheezing, rales, abnormal lung sounds.  No increased work of breathing appreciated. Abd: Nontender and nondistended on palpation to all 4 quadrants.  Positive bowel sounds. Psych: Cooperative with exam. Pleasant. Makes eye contact.     Laboratory: Recent Labs  Lab 10/02/18 0514 10/03/18 0744 10/04/18 0650  WBC 6.7 9.8 7.2  HGB 11.5* 10.4* 11.7*  HCT 36.5 32.4* 37.6  PLT 259 217 244   Recent Labs  Lab 10/02/18 0514 10/03/18 0744 10/04/18 0650  NA 137 131* 133*  K 3.6 3.3* 4.7  CL 102 95* 98  CO2 24 24 26   BUN 18 13 11   CREATININE 1.35* 0.79 0.86  CALCIUM 8.6* 8.2* 8.8*  GLUCOSE 112* 126* 100*    Imaging/Diagnostic Tests: No results found.    Sandre Kittylson, Daniel K, MD 10/05/2018, 6:15 AM PGY-1, [facility] Family Medicine FPTS Intern pager: [phone], text pages welcome

## 2018-10-05 NOTE — Plan of Care (Signed)
  Problem: Education: Goal: Knowledge of General Education information will improve Description: Including pain rating scale, medication(s)/side effects and non-pharmacologic comfort measures Outcome: Progressing   Problem: Clinical Measurements: Goal: Will remain free from infection Outcome: Progressing   Problem: Pain Managment: Goal: General experience of comfort will improve Outcome: Progressing   

## 2018-10-05 NOTE — Clinical Social Work Note (Signed)
Clinical Social Work Assessment  Patient Details  Name: Mia Rogers MRN: [id number] Date of Birth: [birth year]  Date of referral:  10/05/18               Reason for consult:  Discharge Planning                Permission sought to share information with:  Case Manager, Facility Medical sales representativeContact Representative, Family Supports Permission granted to share information::  Yes, Verbal Permission Granted  Name::     [name]  Agency::  SNFs  Relationship::  son and POA  Contact Information:  [phone]  Housing/Transportation Living arrangements for the past 2 months:  Assisted Living Facility([facility]) Source of Information:  Adult Children Patient Interpreter Needed:  None Criminal Activity/Legal Involvement Pertinent to Current Situation/Hospitalization:  No - Comment as needed Significant Relationships:  Adult Children Lives with:  Facility Resident Do you feel safe going back to the place where you live?  No Need for family participation in patient care:  Yes (Comment)  Care giving concerns:  CSW received referral for possible SNF placement at time of discharge. Spoke with patient's sons Minerva Areolaric and Lorin PicketScott [facility] regarding possibility of SNF placement . Patient's  family  is currently unable to care for her at their home given patient's current needs and fall risk. Patient is from [facility] and will need Skilled nursing before returning.  Patient and sons expressed understanding of PT recommendation and are agreeable to SNF placement at time of discharge. CSW to continue to follow and assist with discharge planning needs.     Social Worker assessment / plan:  Spoke with patient's sons concerning [facility] before [facility] home.    Employment status:  Retired Health and safety inspectornsurance information:  Medicare PT Recommendations:  Skilled Nursing Facility Information / Referral to community resources:  Skilled Nursing Facility  Patient/Family's Response to care:  Patient's  sons recognize need for rehab before returning home and are agreeable to a SNF in [place]. They report no preferences at this time. CSW provided medicare.gov list with SNF options including quality measure ratings. CSW explained insurance authorization process. Patient's family reported that they want patient to get stronger to be able to come back home.    Patient/Family's Understanding of and Emotional Response to Diagnosis, Current Treatment, and Prognosis:  Patient/family is realistic regarding therapy needs and expressed being hopeful for SNF placement. Patient expressed understanding of CSW role and discharge process as well as medical condition. No questions/concerns about plan or treatment.    Emotional Assessment Appearance:  Appears stated age Attitude/Demeanor/Rapport:  Gracious Affect (typically observed):  Accepting, Adaptable Orientation:  Oriented to Self, Oriented to Situation Alcohol / Substance use:  Not Applicable Psych involvement (Current and /or in the community):  No (Comment)  Discharge Needs  Concerns to be addressed:  Discharge Planning Concerns Readmission within the last 30 days:  No Current discharge risk:  Dependent with Mobility Barriers to Discharge:  Continued Medical Work up   Dynegyshley M Yadira Hada, LCSW 10/05/2018, 11:31 AM

## 2018-10-06 DIAGNOSIS — G9349 Other encephalopathy: Secondary | ICD-10-CM

## 2018-10-06 DIAGNOSIS — S52021D Displaced fracture of olecranon process without intraarticular extension of right ulna, subsequent encounter for closed fracture with routine healing: Secondary | ICD-10-CM

## 2018-10-06 LAB — CBC WITH DIFFERENTIAL/PLATELET
Abs Immature Granulocytes: 0.02 10*3/uL (ref 0.00–0.07)
Basophils Absolute: 0 10*3/uL (ref 0.0–0.1)
Basophils Relative: 0 %
EOS ABS: 0.2 10*3/uL (ref 0.0–0.5)
Eosinophils Relative: 3 %
HCT: 37.8 % (ref 36.0–46.0)
Hemoglobin: 11.9 g/dL — ABNORMAL LOW (ref 12.0–15.0)
Immature Granulocytes: 0 %
Lymphocytes Relative: 18 %
Lymphs Abs: 1.2 10*3/uL (ref 0.7–4.0)
MCH: 27.8 pg (ref 26.0–34.0)
MCHC: 31.5 g/dL (ref 30.0–36.0)
MCV: 88.3 fL (ref 80.0–100.0)
Monocytes Absolute: 0.6 10*3/uL (ref 0.1–1.0)
Monocytes Relative: 9 %
Neutro Abs: 4.9 10*3/uL (ref 1.7–7.7)
Neutrophils Relative %: 70 %
Platelets: 266 10*3/uL (ref 150–400)
RBC: 4.28 MIL/uL (ref 3.87–5.11)
RDW: 13.8 % (ref 11.5–15.5)
WBC: 6.9 10*3/uL (ref 4.0–10.5)
nRBC: 0 % (ref 0.0–0.2)

## 2018-10-06 LAB — COMPREHENSIVE METABOLIC PANEL
ALT: 12 U/L (ref 0–44)
AST: 28 U/L (ref 15–41)
Albumin: 2.8 g/dL — ABNORMAL LOW (ref 3.5–5.0)
Alkaline Phosphatase: 46 U/L (ref 38–126)
Anion gap: 13 (ref 5–15)
BUN: 12 mg/dL (ref 8–23)
CO2: 23 mmol/L (ref 22–32)
Calcium: 8.4 mg/dL — ABNORMAL LOW (ref 8.9–10.3)
Chloride: 97 mmol/L — ABNORMAL LOW (ref 98–111)
Creatinine, Ser: 0.88 mg/dL (ref 0.44–1.00)
GFR calc Af Amer: >60 mL/min (ref >60)
GFR calc non Af Amer: 59 mL/min — ABNORMAL LOW (ref >60)
Glucose, Bld: 110 mg/dL — ABNORMAL HIGH (ref 70–99)
Potassium: 3.7 mmol/L (ref 3.5–5.1)
Sodium: 133 mmol/L — ABNORMAL LOW (ref 135–145)
Total Bilirubin: 0.9 mg/dL (ref 0.3–1.2)
Total Protein: 6.2 g/dL — ABNORMAL LOW (ref 6.5–8.1)

## 2018-10-06 LAB — GLUCOSE, CAPILLARY: Glucose-Capillary: 89 mg/dL (ref 70–99)

## 2018-10-06 MED ORDER — ACETAMINOPHEN 500 MG PO TABS: 1000.0000 mg | ORAL_TABLET | Freq: Three times a day (TID) | ORAL | 0 refills | Status: DC

## 2018-10-06 NOTE — Clinical Social Work Placement (Signed)
   CLINICAL SOCIAL WORK PLACEMENT  NOTE  Date:  10/06/2018  Patient Details  Name: Mia Rogers MRN: [id number] Date of Birth: [birth year]  Clinical Social Work is seeking post-discharge placement for this patient at the Skilled  Nursing Facility level of care (*CSW will initial, date and re-position this form in  chart as items are completed):      Patient/family provided with [facility] Clinical Social Work Department's list of facilities offering this level of care within the geographic area requested by the patient (or if unable, by the patient's family).  Yes   Patient/family informed of their freedom to choose among providers that offer the needed level of care, that participate in Medicare, Medicaid or managed care program needed by the patient, have an available bed and are willing to accept the patient.      Patient/family informed of [facility]'s ownership interest in [facility] and [facility], as well as of the fact that they are under no obligation to receive care at these facilities.  PASRR submitted to EDS on       PASRR number received on 10/05/18     Existing PASRR number confirmed on       FL2 transmitted to all facilities in geographic area requested by pt/family on 10/05/18     FL2 transmitted to all facilities within larger geographic area on       Patient informed that his/her managed care company has contracts with or will negotiate with certain facilities, including the following:            Patient/family informed of bed offers received.  [facility]     Physician recommends and patient chooses bed at      [facility] 10[facility]06/18.  Patient to be transferred to facility by PTAR     Patient family notified on 10/06/18 of transfer.  Name of family member notified:  [name]     PHYSICIAN       Additional Comment:    _______________________________________________ Gildardo GriffesAshley M Lenzy Kerschner,  LCSW 10/06/2018, 2:48 PM

## 2018-10-06 NOTE — Progress Notes (Signed)
Family notified of transfer. Attempted to call report to facility x2 , no response just VM.

## 2018-10-06 NOTE — Progress Notes (Signed)
Patient will DC to:[facility] Anticipated DC date: 10/06/18 Family notified: [name] Transport by: Sharin MonsPTAR  Per MD patient [facility] . RN, patient, patient's family, and facility notified of DC. Discharge Summary sent to facility. RN given number for report [phone]. DC packet on chart. Ambulance transport requested for patient.  CSW signing off.  Garza-Salinas IIAshley Keigan [place]af[place], KentuckyLCSW [phone]

## 2018-10-06 NOTE — Progress Notes (Signed)
This morning I went to see the patient at approximately 930am.  Per the son, she had woken up, then fallen back asleep after refusing breakfast.  She did not wake up again, despite repeated attempts from the family, until I saw her at that time.  Upon my exam, she was very difficult to arouse.  She did not open her eyes but was responding appropriately to questioning and could follow commands.  She had no focal neural deficits that would indicate a stroke.  HR and pulses were normal, lungs were clear on exam.  Glucose was normal.  CBC and CMP was drawn and it was decided to reasssess the patient after rounds.  After rounds, the labs revealed no electrolyte abnormalities, no anemia, no infection. I went back to the patient's room and she was sitting up in the chair, fully awake and alert.  We had a pleasant conversation, in which she was oriented and coherent.  Per the nurse, the patient ambulated to the restroom with minimal assistance before sitting in her chair.  From this information outlined above, patient is back to the status she was yesterday, and is suitable for SNF placement today.    Frederic Jerichoan Jaziyah Gradel PGY-1 Family Medicine.

## 2018-10-06 NOTE — Progress Notes (Signed)
Physical Therapy Treatment Patient Details Name: Mia Rogers MRN: [id number] DOB: 1928/01/28 Today's Date: 10/06/2018    History of Present Illness Pt is an 84 y/o female with PMHx including Dementia, Essential tremor, and Hypothyroid. Pt presenting after a mechanical fall sustaning maxillary fractures and R olecranon fracture. Pt is now s/p ORIF R olecranon fracture.     PT Comments    Patient seen for mobility progression. Pt is much more alert and participatory this session versus previously noted PT session. Pt requires min/mod A for functional transfers and gait training. Pt did tolerated increased gait distance this session however continues to c/o R LE pain with weight bearing. Continue to progress as tolerated.     Follow Up Recommendations  Home health PT;Supervision/Assistance - 24 hour;Other (comment)(if ALF can provide 24 hour care and therapies)     Equipment Recommendations       Recommendations for Other Services OT consult     Precautions / Restrictions Precautions Precautions: Fall Required Braces or Orthoses: Sling Restrictions Weight Bearing Restrictions: Yes RUE Weight Bearing: Non weight bearing    Mobility  Bed Mobility               General bed mobility comments: pt OOB in chair upon arrival   Transfers Overall transfer level: Needs assistance Equipment used: 1 person hand held assist(and assist at trunk with use of gait belt) Transfers: Sit to/from Stand Sit to Stand: Min assist         General transfer comment: assist to power up into standing and for safe decent to chair; cues for safe hand placement  Ambulation/Gait Ambulation/Gait assistance: Min assist;Mod assist Gait Distance (Feet): 75 Feet Assistive device: 1 person hand held assist(assist at trunk with gait belt) Gait Pattern/deviations: Step-through pattern;Decreased stance time - right;Decreased step length - left;Decreased step length - right;Decreased weight shift to  right;Antalgic Gait velocity: decreased   General Gait Details: assist for balance and weight shifting; cues for posture/forward gaze and increased stride length    Stairs             Wheelchair Mobility    Modified Rankin (Stroke Patients Only)       Balance Overall balance assessment: Needs assistance Sitting-balance support: Feet supported Sitting balance-Leahy Scale: Good     Standing balance support: Single extremity supported Standing balance-Leahy Scale: Poor                              Cognition Arousal/Alertness: Awake/alert Behavior During Therapy: WFL for tasks assessed/performed Overall Cognitive Status: History of cognitive impairments - at baseline                                 General Comments: Very agreeable and enjoyed therapy. repeating information including how she loves to dance and she is the same age as Kris Hartmann      Exercises      General Comments General comments (skin integrity, edema, etc.): son present      Pertinent Vitals/Pain Pain Assessment: Faces Faces Pain Scale: Hurts little more Pain Location: R elbow and R LE with ambulation Pain Descriptors / Indicators: Guarding;Sore Pain Intervention(s): Limited activity within patient's tolerance;Monitored during session;Repositioned    Home Living                      Prior Function  PT Goals (current goals can now be found in the care plan section) Acute Rehab PT Goals Patient Stated Goal: get back to moving Progress towards PT goals: Progressing toward goals    Frequency    Min 3X/week      PT Plan Current plan remains appropriate    Co-evaluation              AM-PAC PT "6 Clicks" Mobility   Outcome Measure  Help needed turning from your back to your side while in a flat bed without using bedrails?: A Lot Help needed moving from lying on your back to sitting on the side of a flat bed without using  bedrails?: A Lot Help needed moving to and from a bed to a chair (including a wheelchair)?: A Lot Help needed standing up from a chair using your arms (e.g., wheelchair or bedside chair)?: A Little Help needed to walk in hospital room?: A Lot Help needed climbing 3-5 steps with a railing? : A Lot 6 Click Score: 13    End of Session Equipment Utilized During Treatment: Gait belt Activity Tolerance: Patient tolerated treatment well Patient left: in chair;with call bell/phone within reach;with chair alarm set;with family/visitor present Nurse Communication: Mobility status PT Visit Diagnosis: Unsteadiness on feet (R26.81);Other abnormalities of gait and mobility (R26.89);Muscle weakness (generalized) (M62.81);History of falling (Z91.81)     Time: 1345-1406 PT Time Calculation (min) (ACUTE ONLY): 21 min  Charges:  $Gait Training: 8-22 mins                     Erline LevineKellyn Rhaelyn Giron, PTA Acute Rehabilitation Services Pager: [phone] Office: [phone]     Carolynne EdouardKellyn R Cosima Prentiss 10/06/2018, 4:21 PM

## 2018-10-06 NOTE — Progress Notes (Signed)
Family Medicine Teaching Service Daily Progress Note Intern Pager: [phone]  Patient name: Mia Rogers Medical record number: [id number] Date of birth: Jun 30, 1928 Age: 84 y.o. Gender: female  Primary Care Provider: Manus Gunning, FNP Consultants: ortho Code Status: Full code  Pt Overview and Major Events to Date:  Hospital Day 5 Admitted: 10/01/2018  Assessment and Plan: Mia Rogers a 84 y.o.femalepresenting after a mechanical fall with maxillary fractures and olecranon fracture.Marland Kitchen PMH is significant forhypothyroidism, dementia.   R maxilla and olecranon fractures 2/2 mechanical fall, s/p ORIF  POD 4 after ORIF of R olecranon fracture.  Goals include pain control and PT/OT.  No intervention from ENT standpoint.  No other bony injuries utilized on other x-rays of right side. Patient did not have oxycodone PRN for pain yesterday. This morning patient was very difficult to awake, but on repeat exam two hours later she was alert and not in any acute pain. We decreased her oxy to 2.5 q6h due to confusion.    -Ortho on board, appreciate recs -Pain control: Tylenol 1,000 mg TID, oxy 2.5mg   q4h PRN moderate pain, methocarbamol 500mg  q6h.  -SCDs - PT/OT following surgery  Chest pain  - patient has complained about chest pain the past two mornings.  EKG and troponin was negative.  Pain is most likely reflux based on symptoms and previous history of reflux.   - start famotidine 20mg    AKI: Resolved CR < 1 currently after rising to 1.35 on 12/7 after being 1.05 on admit.   - Monitor fluid/p.o. intake   Hypothyroidism: Chronic, stable. Follows with PCP for TSH regularly.  - continue home Synthroid 50 mcg   Dementia: Chronic, stable.   Her son is HCPOA.  Patient had an episode of agitation, likely due to delirium, on night of 12/7 and could not be redirected, so she received Haldol 2 mg once.  patient's confusion has improved each day with the reduction in oxycodone use.   -  Continue home aricept  - Continue home seroquel -Continue to reorient and will discharge as soon as it is medically safe  Essentialtremor: Chronic, stable.   Tremor of head, neck, and arms visible on admit.  -Continue home propranolol - on d/c summ consider all day coverage.   FEN/GI: Heart healthy diet Prophylaxis: SCDs  Disposition: SNF.     Medications: Scheduled Meds: . acetaminophen  1,000 mg Oral TID  . donepezil  10 mg Oral QHS  . famotidine  20 mg Oral Daily  . levothyroxine  50 mcg Oral Once per day on Sun Mon Tue Thu Fri Sat  . polyethylene glycol  17 g Oral Daily  . propranolol  40 mg Oral Daily  . QUEtiapine  50 mg Oral QHS  . senna  1 tablet Oral Daily   Continuous Infusions:  PRN Meds: alum & mag hydroxide-simeth, menthol-cetylpyridinium, methocarbamol **OR** [DISCONTINUED] methocarbamol (ROBAXIN) IV, metoCLOPramide **OR** metoCLOPramide (REGLAN) injection, ondansetron **OR** ondansetron (ZOFRAN) IV, oxyCODONE  ================================================= ================================================= Subjective:  Patient was difficult to awake this morning, only responding after repeated sternal rubs.  She said she was sleepy, and could follow commands and respond appropriately. When seen two hours later she was able to converse and stated she didn't remember the encounter this morning.  She had no complaints otherwise, and was feeling well.       Objective: Temp:  [97.5 F (36.4 C)-98.6 F (37 C)] 97.5 F (36.4 C) (12/11 0315) Pulse Rate:  [80-84] 84 (12/11 0315) Resp:  [14-16] 14 (12/11 0315)  BP: (123-140)/(84-87) 123/84 (12/11 0315) SpO2:  [96 %-99 %] 96 % (12/11 0315) Intake/Output 12/10 0701 - 12/11 0700 In: 120 [P.O.:120] Out: -  Physical Exam:   Gen:  , tired, sleeping throughout most of exam. Did not open eyes during exam and did not respond to questioning until after repeated sternal rubs. On repeat exam two hours later she was  alert and oriented.  No acute distress.    HEENT: Normocephaic, atraumatic. Clear conjuctiva, no scleral icterus and injection.  CV:  Regular rate and rhythm,  Normal capillary refill bilaterally.  Radial pulses 2+ bilaterally. No bilateral lower extremity edema. Resp: Clear to auscultation bilaterally.  No wheezing, rales, abnormal lung sounds.  No increased work of breathing appreciated. Abd: Nontender and nondistended on palpation to all 4 quadrants.  Positive bowel sounds. Neuro equal strength upper and lower extremities bilaterally.  Moves limbs spontaneously Psych: able to respond appropriately to questioning and follow commands.      Laboratory: Recent Labs  Lab 10/02/18 0514 10/03/18 0744 10/04/18 0650  WBC 6.7 9.8 7.2  HGB 11.5* 10.4* 11.7*  HCT 36.5 32.4* 37.6  PLT 259 217 244   Recent Labs  Lab 10/02/18 0514 10/03/18 0744 10/04/18 0650  NA 137 131* 133*  K 3.6 3.3* 4.7  CL 102 95* 98  CO2 24 24 26   BUN 18 13 11   CREATININE 1.35* 0.79 0.86  CALCIUM 8.6* 8.2* 8.8*  GLUCOSE 112* 126* 100*    Imaging/Diagnostic Tests: No results found.    Sandre Kittylson, Lulani Bour K, MD 10/06/2018, 5:54 AM PGY-1,  Family Medicine FPTS Intern pager: [phone], text pages welcome

## 2018-10-06 NOTE — Progress Notes (Signed)
Per resident physician on 12/10 patient would be ready to dc to SNF today 12/11.   CSW called intern pager and requested DC summary and orders this morning due to med surg. Hospital requesting discharges in the morning due to influx in beds needed.   Resident reports they will not be able to start the discharge summary until after they finish rounding around 1:00 pm.   This information was relayed to RN Cassie in charge of discharges to identify barriers in morning discharges before 11:00 am.   CSW continuing to await dc summary and orders for patient to discharge to SNF.   Vail[place]ley Lela Gell, [place]CS[place] [phone]

## 2018-10-06 NOTE — Progress Notes (Signed)
Patient noted to have altered level of consciousness for rounding physician this AM. Stat labs placed. Actively managing the patient, hold off on DC for now. Potential DC later today.

## 2018-12-02 ENCOUNTER — Inpatient Hospital Stay (HOSPITAL_COMMUNITY)
Admission: EM | Admit: 2018-12-02 | Discharge: 2018-12-06 | DRG: 083 | Disposition: A | Discharge status: Hospice | Payer: Medicare Other | Attending: Family Medicine | Admitting: Family Medicine

## 2018-12-02 ENCOUNTER — Emergency Department (HOSPITAL_COMMUNITY): Payer: Medicare Other

## 2018-12-02 ENCOUNTER — Encounter (HOSPITAL_COMMUNITY): Payer: Self-pay | Admitting: Emergency Medicine

## 2018-12-02 DIAGNOSIS — S0990XA Unspecified injury of head, initial encounter: Secondary | ICD-10-CM | POA: Diagnosis not present

## 2018-12-02 DIAGNOSIS — S12100A Unspecified displaced fracture of second cervical vertebra, initial encounter for closed fracture: Secondary | ICD-10-CM

## 2018-12-02 DIAGNOSIS — E876 Hypokalemia: Secondary | ICD-10-CM | POA: Diagnosis present

## 2018-12-02 DIAGNOSIS — Z66 Do not resuscitate: Secondary | ICD-10-CM | POA: Diagnosis present

## 2018-12-02 DIAGNOSIS — Y92129 Unspecified place in nursing home as the place of occurrence of the external cause: Secondary | ICD-10-CM | POA: Diagnosis not present

## 2018-12-02 DIAGNOSIS — Z7189 Other specified counseling: Secondary | ICD-10-CM | POA: Diagnosis not present

## 2018-12-02 DIAGNOSIS — E039 Hypothyroidism, unspecified: Secondary | ICD-10-CM | POA: Diagnosis present

## 2018-12-02 DIAGNOSIS — Z79890 Hormone replacement therapy (postmenopausal): Secondary | ICD-10-CM

## 2018-12-02 DIAGNOSIS — S065X9A Traumatic subdural hemorrhage with loss of consciousness of unspecified duration, initial encounter: Principal | ICD-10-CM | POA: Diagnosis present

## 2018-12-02 DIAGNOSIS — Z79899 Other long term (current) drug therapy: Secondary | ICD-10-CM | POA: Diagnosis not present

## 2018-12-02 DIAGNOSIS — Z515 Encounter for palliative care: Secondary | ICD-10-CM | POA: Diagnosis present

## 2018-12-02 DIAGNOSIS — W19XXXA Unspecified fall, initial encounter: Secondary | ICD-10-CM | POA: Diagnosis not present

## 2018-12-02 DIAGNOSIS — G250 Essential tremor: Secondary | ICD-10-CM | POA: Diagnosis present

## 2018-12-02 DIAGNOSIS — G8191 Hemiplegia, unspecified affecting right dominant side: Secondary | ICD-10-CM | POA: Diagnosis present

## 2018-12-02 DIAGNOSIS — I10 Essential (primary) hypertension: Secondary | ICD-10-CM | POA: Diagnosis present

## 2018-12-02 DIAGNOSIS — F0391 Unspecified dementia with behavioral disturbance: Secondary | ICD-10-CM | POA: Diagnosis not present

## 2018-12-02 DIAGNOSIS — W1830XA Fall on same level, unspecified, initial encounter: Secondary | ICD-10-CM | POA: Diagnosis present

## 2018-12-02 DIAGNOSIS — S065XAA Traumatic subdural hemorrhage with loss of consciousness status unknown, initial encounter: Secondary | ICD-10-CM | POA: Diagnosis present

## 2018-12-02 DIAGNOSIS — F0390 Unspecified dementia without behavioral disturbance: Secondary | ICD-10-CM | POA: Diagnosis present

## 2018-12-02 DIAGNOSIS — F05 Delirium due to known physiological condition: Secondary | ICD-10-CM | POA: Diagnosis not present

## 2018-12-02 LAB — BASIC METABOLIC PANEL
ANION GAP: 14 (ref 5–15)
BUN: 14 mg/dL (ref 8–23)
CO2: 23 mmol/L (ref 22–32)
Calcium: 8.8 mg/dL — ABNORMAL LOW (ref 8.9–10.3)
Chloride: 104 mmol/L (ref 98–111)
Creatinine, Ser: 0.79 mg/dL (ref 0.44–1.00)
GFR calc Af Amer: >60 mL/min (ref >60)
Glucose, Bld: 101 mg/dL — ABNORMAL HIGH (ref 70–99)
Potassium: 3 mmol/L — ABNORMAL LOW (ref 3.5–5.1)
Sodium: 141 mmol/L (ref 135–145)

## 2018-12-02 LAB — URINALYSIS, ROUTINE W REFLEX MICROSCOPIC
Bilirubin Urine: NEGATIVE
Glucose, UA: NEGATIVE mg/dL
HGB URINE DIPSTICK: NEGATIVE
Ketones, ur: 20 mg/dL — AB
Nitrite: NEGATIVE
Protein, ur: NEGATIVE mg/dL
Specific Gravity, Urine: 1.014 (ref 1.005–1.030)
pH: 6 (ref 5.0–8.0)

## 2018-12-02 LAB — CBC WITH DIFFERENTIAL/PLATELET
Abs Immature Granulocytes: 0.02 10*3/uL (ref 0.00–0.07)
Basophils Absolute: 0 10*3/uL (ref 0.0–0.1)
Basophils Relative: 1 %
Eosinophils Absolute: 0.1 10*3/uL (ref 0.0–0.5)
Eosinophils Relative: 2 %
HCT: 34 % — ABNORMAL LOW (ref 36.0–46.0)
Hemoglobin: 10.9 g/dL — ABNORMAL LOW (ref 12.0–15.0)
Immature Granulocytes: 0 %
Lymphocytes Relative: 20 %
Lymphs Abs: 1.3 10*3/uL (ref 0.7–4.0)
MCH: 29.5 pg (ref 26.0–34.0)
MCHC: 32.1 g/dL (ref 30.0–36.0)
MCV: 92.1 fL (ref 80.0–100.0)
Monocytes Absolute: 0.5 10*3/uL (ref 0.1–1.0)
Monocytes Relative: 7 %
NEUTROS ABS: 4.5 10*3/uL (ref 1.7–7.7)
NEUTROS PCT: 70 %
PLATELETS: 292 10*3/uL (ref 150–400)
RBC: 3.69 MIL/uL — ABNORMAL LOW (ref 3.87–5.11)
RDW: 15 % (ref 11.5–15.5)
WBC: 6.4 10*3/uL (ref 4.0–10.5)
nRBC: 0 % (ref 0.0–0.2)

## 2018-12-02 LAB — TYPE AND SCREEN
ABO/RH(D): O POS
Antibody Screen: NEGATIVE

## 2018-12-02 LAB — TROPONIN I: Troponin I: <0.03 ng/mL (ref <0.03)

## 2018-12-02 LAB — PROTIME-INR
INR: 1.1
Prothrombin Time: 14.1 seconds (ref 11.4–15.2)

## 2018-12-02 LAB — ABO/RH: ABO/RH(D): O POS

## 2018-12-02 LAB — BRAIN NATRIURETIC PEPTIDE: B Natriuretic Peptide: 126 pg/mL — ABNORMAL HIGH (ref 0.0–100.0)

## 2018-12-02 LAB — MAGNESIUM: Magnesium: 1.8 mg/dL (ref 1.7–2.4)

## 2018-12-02 LAB — APTT: aPTT: 29 seconds (ref 24–36)

## 2018-12-02 MED ORDER — ACETAMINOPHEN 650 MG RE SUPP
650.0000 mg | Freq: Four times a day (QID) | RECTAL | Status: DC | PRN
  Administered 2018-12-03: 650 mg via RECTAL
  Filled 2018-12-02: qty 1

## 2018-12-02 MED ORDER — ACETAMINOPHEN 325 MG PO TABS: 650.0000 mg | ORAL_TABLET | Freq: Four times a day (QID) | ORAL | Status: DC | PRN

## 2018-12-02 MED ORDER — POTASSIUM CHLORIDE 10 MEQ/100ML IV SOLN
10.0000 meq | INTRAVENOUS | Status: AC
  Administered 2018-12-02 (×4): 10 meq via INTRAVENOUS
  Filled 2018-12-02 (×4): qty 100

## 2018-12-02 MED ORDER — SODIUM CHLORIDE 0.9 % IV SOLN
INTRAVENOUS | Status: AC
  Administered 2018-12-02: 18:00:00 via INTRAVENOUS
  Administered 2018-12-03: 1000 mL via INTRAVENOUS

## 2018-12-02 NOTE — ED Provider Notes (Signed)
[facility] EMERGENCY DEPARTMENT Provider Note   CSN: [id number] Arrival date & time: 12/02/18  4098     History   Chief Complaint Chief Complaint  Patient presents with  . Fall    HPI Miata Culbreth is a 85 y.o. female history of dementia, essential tremor, hypothyroid presenting to emergency department with chief complaint of fall.  Patient is not on blood thinners. Patient had a witnessed fall at [facility].  She fell forward onto the bed but then slid down to the floor hitting her face on the floor.  Patient denies pain.  She has a large hematoma on left forehead.  EMS reports patient has had medication changes recently and seems more lethargic over the last few days and she had multiple falls in the last few days. Level 5 caveat due to dementia.  History provided by nurse and son.    Past Medical History:  Diagnosis Date  . Dementia (HCC)   . Essential tremor   . Hypothyroid     Patient Active Problem List   Diagnosis Date Noted  . Subdural hematoma (HCC) 12/02/2018  . Other encephalopathy   . Fall 10/01/2018  . Closed fracture of right olecranon process     Past Surgical History:  Procedure Laterality Date  . ORIF ELBOW FRACTURE Right 10/02/2018   Procedure: OPEN REDUCTION INTERNAL FIXATION (ORIF) ELBOW/OLECRANON FRACTURE;  Surgeon: Sheral Apley, MD;  Location: [facility] OR;  Service: Orthopedics;  Laterality: Right;     OB History   No obstetric history on file.      Home Medications    Prior to Admission medications   Medication Sig Start Date End Date Taking? Authorizing Provider  acetaminophen (TYLENOL) 500 MG tablet Take 2 tablets (1,000 mg total) by mouth 3 (three) times daily. 10/06/18  Yes Sandre Kitty, MD  donepezil (ARICEPT) 10 MG tablet Take 10 mg by mouth at bedtime.   Yes [provider]  levothyroxine (SYNTHROID, LEVOTHROID) 50 MCG tablet Take 50 mcg by mouth See admin instructions. Take one tablet  (50 mcg) by mouth Sunday, Monday, Tuesday, Thursday, Friday Saturday at 6am (skip Wednesday)   Yes [provider]  Multiple Vitamin (DAILY VITE) TABS Take 1 tablet by mouth daily.   Yes [provider]  propranolol (INDERAL) 40 MG tablet Take 40 mg by mouth daily at 6 (six) AM.   Yes [provider]  QUEtiapine (SEROQUEL) 25 MG tablet Take 12.5 mg by mouth at bedtime.    Yes [provider]    Family History History reviewed. No pertinent family history.  Social History Social History   Tobacco Use  . Smoking status: Never Smoker  . Smokeless tobacco: Never Used  Substance Use Topics  . Alcohol use: Never    Frequency: Never  . Drug use: Never     Allergies   Patient has no known allergies.   Review of Systems Review of Systems  Unable to perform ROS: Dementia     Physical Exam Updated Vital Signs BP (!) 173/85   Pulse (!) 58   Temp 97.8 F (36.6 C) (Oral)   Resp (!) 25   SpO2 97%   Physical Exam Vitals signs and nursing note reviewed.  Constitutional:      Appearance: She is not ill-appearing or toxic-appearing.  HENT:     Head: Normocephalic.     Comments: 2.5 cm x 5 cm hematoma on left forehead. No battle's sign.    Right  Ear: Tympanic membrane normal.     Left Ear: Tympanic membrane normal.     Nose: Nose normal.     Mouth/Throat:     Mouth: Mucous membranes are moist.     Pharynx: Oropharynx is clear.  Eyes:     Extraocular Movements: Extraocular movements intact.     Conjunctiva/sclera: Conjunctivae normal.     Pupils: Pupils are equal, round, and reactive to light.  Neck:     Musculoskeletal: Normal range of motion.  Cardiovascular:     Rate and Rhythm: Normal rate and regular rhythm.     Pulses: Normal pulses.     Heart sounds: Normal heart sounds.  Pulmonary:     Effort: Pulmonary effort is normal.     Breath sounds: Normal breath sounds.  Abdominal:     General: There is no distension.     Palpations:  Abdomen is soft.     Tenderness: There is no abdominal tenderness. There is no guarding or rebound.  Musculoskeletal: Normal range of motion.  Skin:    General: Skin is warm and dry.     Capillary Refill: Capillary refill takes less than 2 seconds.  Neurological:     Mental Status: She is alert. Mental status is at baseline.     Motor: No weakness.     Comments: Pt is confused, has dementia. Speech is clear and goal oriented, follows simple commands. No facial droop, no facial asymmetry.  Normal strength in upper and lower extremities bilaterally including dorsiflexion and plantar flexion, strong and equal grip strength  Moves extremities without ataxia.    Psychiatric:        Behavior: Behavior normal.      ED Treatments / Results  Labs (all labs ordered are listed, but only abnormal results are displayed) Labs Reviewed  CBC WITH DIFFERENTIAL/PLATELET - Abnormal; Notable for the following components:      Result Value   RBC 3.69 (*)    Hemoglobin 10.9 (*)    HCT 34.0 (*)    All other components within normal limits  BASIC METABOLIC PANEL - Abnormal; Notable for the following components:   Potassium 3.0 (*)    Glucose, Bld 101 (*)    Calcium 8.8 (*)    All other components within normal limits  URINALYSIS, ROUTINE W REFLEX MICROSCOPIC - Abnormal; Notable for the following components:   APPearance HAZY (*)    Ketones, ur 20 (*)    Leukocytes, UA TRACE (*)    Bacteria, UA MANY (*)    All other components within normal limits  APTT  PROTIME-INR  MAGNESIUM  TYPE AND SCREEN  ABO/RH    EKG EKG Interpretation  Date/Time:  Thursday December 02 2018 09:16:56 EST Ventricular Rate:  66 PR Interval:    QRS Duration: 117 QT Interval:  409 QTC Calculation: 429 R Axis:   -24 Text Interpretation:  Sinus rhythm Nonspecific intraventricular conduction delay Inferior infarct, old Borderline ST elevation, anterolateral leads Baseline wander in lead(s) V6 No significant change  since last tracing Confirmed by Lorre NickAllen, Anthony ([id number]) on 12/02/2018 10:09:37 AM   Radiology Ct Head Wo Contrast  Result Date: 12/02/2018 CLINICAL DATA:  Fall, head trauma EXAM: CT HEAD WITHOUT CONTRAST; CT CERVICAL SPINE WITHOUT CONTRAST TECHNIQUE: Contiguous axial images were obtained through the brain and cervical spine without intravenous contrast. COMPARISON:  10/01/2018 FINDINGS: CT HEAD FINDINGS Brain: There are large bilateral mixed attenuation subdural hemorrhages with internal fluid fluid levels, measuring 1.9 cm in maximum coronal thickness on  the left and 1.6 cm in maximum coronal thickness on the right. There is substantial mass effect on the bilateral cerebral hemispheres and lateral ventricles without evidence of midline or downward shift. There is underlying small-vessel white matter hypodensity. Vascular: No hyperdense vessel or unexpected calcification. Skull: Normal. Negative for fracture or focal lesion. Sinuses/Orbits: No acute finding. Other: Large left frontal scalp hematoma. CT CERVICAL SPINE FINDINGS Alignment: Normal. Skull base and vertebrae: There is a minimally displaced type 1 dens fracture (series 13, image 30). No primary bone lesion or focal pathologic process. Soft tissues and spinal canal: No prevertebral fluid or swelling. No visible canal hematoma. Disc levels:  Severe multilevel disc and facet degenerative disease Upper chest: Negative. Other: None. IMPRESSION: 1. There are large bilateral mixed attenuation subdural hemorrhages with internal fluid fluid levels, measuring 1.9 cm in maximum coronal thickness on the left and 1.6 cm in maximum coronal thickness on the right. There is substantial mass effect on the bilateral cerebral hemispheres and lateral ventricles without evidence of midline or downward shift. Although new compared to prior examination dated 10/01/2018, these appear to be acute on subacute to chronic suggesting multiple hemorrhage events in the interval. 2.   Small-vessel white matter disease. 3.  Minimally displaced type 1 dens fracture (series 13, image 30). 4. Severe multilevel disc and facet degenerative disease of the cervical spine. These results were called by telephone at the time of interpretation on 12/02/2018 at 10:57 am to Dr. Doug Sou ; Lorre Nick , who verbally acknowledged these results. Electronically Signed   By: Lauralyn Primes M.D.   On: 12/02/2018 10:57   Ct Cervical Spine Wo Contrast  Result Date: 12/02/2018 CLINICAL DATA:  Fall, head trauma EXAM: CT HEAD WITHOUT CONTRAST; CT CERVICAL SPINE WITHOUT CONTRAST TECHNIQUE: Contiguous axial images were obtained through the brain and cervical spine without intravenous contrast. COMPARISON:  10/01/2018 FINDINGS: CT HEAD FINDINGS Brain: There are large bilateral mixed attenuation subdural hemorrhages with internal fluid fluid levels, measuring 1.9 cm in maximum coronal thickness on the left and 1.6 cm in maximum coronal thickness on the right. There is substantial mass effect on the bilateral cerebral hemispheres and lateral ventricles without evidence of midline or downward shift. There is underlying small-vessel white matter hypodensity. Vascular: No hyperdense vessel or unexpected calcification. Skull: Normal. Negative for fracture or focal lesion. Sinuses/Orbits: No acute finding. Other: Large left frontal scalp hematoma. CT CERVICAL SPINE FINDINGS Alignment: Normal. Skull base and vertebrae: There is a minimally displaced type 1 dens fracture (series 13, image 30). No primary bone lesion or focal pathologic process. Soft tissues and spinal canal: No prevertebral fluid or swelling. No visible canal hematoma. Disc levels:  Severe multilevel disc and facet degenerative disease Upper chest: Negative. Other: None. IMPRESSION: 1. There are large bilateral mixed attenuation subdural hemorrhages with internal fluid fluid levels, measuring 1.9 cm in maximum coronal thickness on the left and 1.6 cm in  maximum coronal thickness on the right. There is substantial mass effect on the bilateral cerebral hemispheres and lateral ventricles without evidence of midline or downward shift. Although new compared to prior examination dated 10/01/2018, these appear to be acute on subacute to chronic suggesting multiple hemorrhage events in the interval. 2.  Small-vessel white matter disease. 3.  Minimally displaced type 1 dens fracture (series 13, image 30). 4. Severe multilevel disc and facet degenerative disease of the cervical spine. These results were called by telephone at the time of interpretation on 12/02/2018 at 10:57 am to Dr.  Travontae Freiberger ; Lorre Nick , who verbally acknowledged these results. Electronically Signed   By: Lauralyn Primes M.D.   On: 12/02/2018 10:57    Procedures Procedures (including critical care time)  Medications Ordered in ED Medications  0.9 %  sodium chloride infusion (has no administration in time range)  acetaminophen (TYLENOL) tablet 650 mg (has no administration in time range)    Or  acetaminophen (TYLENOL) suppository 650 mg (has no administration in time range)  potassium chloride 10 mEq in 100 mL IVPB (has no administration in time range)     Initial Impression / Assessment and Plan / ED Course  I have reviewed the triage vital signs and the nursing notes.  Pertinent labs & imaging results that were available during my care of the patient were reviewed by me and considered in my medical decision making (see chart for details).   Patient is afebrile, non toxic appearing. Pt has history of dementia and is unable to provide clear history of what happened prior to the fall.  I did speak with staff at [facility] who reported that she suspects it was a mechanical fall. She fell forward landing on her face.  Staff confirmed that the fall was witnessed and that she does not take blood thinners. Also reports 5 days ago she had 2 unwitnessed falls.  They had  the physician come out to evaluate her and he decreased her Seroquel dose to 12.5mg  thinking that might be the cause.  They did not send her anywhere for evaluation after these falls.   Today patient's neuro exam includes full range of motion of all extremities and she is able to follow simple commands.  She appears to be at her baseline. She is not complaining of neck pain and on exam her neck is nontender. She has large hematoma to left forehead. Will get CT head and C-spine as well as BMP and CBC given her recent falls to look for electrolyte abnormalities or other possible causes of fall. I viewed her EKG and do not see ischemic changes, it is unchanged from prior.  Discussed Ct findings with radiologist Dr. Jayme Cloud.  He reports large bilateral subdural hematomas with 1.9 cm coronal thickness on the left and 1.6 cm coronal thickness on the right as well as substantial mass effect on the bilateral cerebral hemispheres and lateral ventricles without evidence of shift. She also has minimally displace type 1 dens fracture.  I viewed these images and consulted neurosurgery Dr. Newell Coral. He discussed treatment options with her son and the plan is to have a family meeting tomorrow when the other siblings arrive to discuss medical management. Unfortunately given the pt's age and dementia surgical intervention would likely have poor prognosis. Pt will need higher level of care than the assisted living she has previously been staying. Added PT, aPTT, type and screen after these findings and pt placed in Aspen collar. On reassessment pt continues to deny pain. Pt admitted to family medicine service with neurosurgery consult. The patient was discussed with and seen by Dr. Freida Busman who agrees with the treatment plan.     Final Clinical Impressions(s) / ED Diagnoses   Final diagnoses:  Injury of head, initial encounter  Fall, initial encounter    ED Discharge Orders    None       Kathyrn Lass 12/02/18 1704    Lorre Nick, MD 12/04/18 1123

## 2018-12-02 NOTE — ED Notes (Signed)
New Bruising noted under pt's left eye

## 2018-12-02 NOTE — ED Provider Notes (Signed)
Medical screening examination/treatment/procedure(s) were conducted as a shared visit with non-physician practitioner(s) and myself.  I personally evaluated the patient during the encounter.  None 85 year old female here for mechanical fall from nursing home.  Has hematoma to her forehead.  She has no focal deficits on exam.  Will perform imaging and likely discharge home   Lorre Nick, MD 12/02/18 1010

## 2018-12-02 NOTE — Consult Note (Signed)
Reason for Consult: Large bilateral subdural hematomas Referring Physician: Doug SouKaitlyn Albrizze, PA and Lorre NickAnthony Allen, MD (EDP providers  Murrell ReddenBonnie Rockhill is an 85 y.o. female.  HPI: Elderly white female, resident of [facility] assisted [facility] facility in [place] who was brought to the [facility] emergency room morning after having fallen at the assisted living facility.  She was found to have a significant bruise of the left forehead and supraorbital region, and was evaluated with CT of the brain and cervical spine.  The CT of the brain shows large, bilateral, mixed density subdural hematomas with mass-effect, but little in the way of midline shift, probably due to the bilateral collections.  CT of the cervical spine shows a mildly angulated type I odontoid fracture.  Neurosurgical consultation was requested for further recommendations.  I spoke with Ms. Albrizze and Dr. Freida BusmanAllen, as well as with the patient's son [name] [phone]).  Patient does have a history of hypothyroidism, essential trauma, and dementia.  Symptomatically she does have some soreness where the bruise of the left forehead and supraorbital region is located, but she denies any headache.  Her son explains that she did have a significant fall on October 01 2018, and was hospitalized here at [facility].  She suffered facial fractures, a left elbow fracture, and did require some stitches.  Dr. Renaye Rakersim Murphy, from orthopedics, placed pins for the left elbow fracture.  Subsequently she was discharged to [facility] skilled [facility] facility for rehabilitation.  He explains that she had some behavioral difficulties while recovering at [facility], for which she was started on Seroquel.  She was subsequently transferred back to [facility] facility 2 to 3 weeks ago.  Her son notes that for the past couple of weeks she has had increased difficulties with speech and walking.  He subsequently had a family  conference at [facility], and they had the physician reduce the Seroquel dose in half.  She apparently did have a minor fall in the bathroom a few days ago, as well as the fall earlier today.  Past Medical History:  Past Medical History:  Diagnosis Date  . Dementia (HCC)   . Essential tremor   . Hypothyroid     Past Surgical History:  Past Surgical History:  Procedure Laterality Date  . ORIF ELBOW FRACTURE Right 10/02/2018   Procedure: OPEN REDUCTION INTERNAL FIXATION (ORIF) ELBOW/OLECRANON FRACTURE;  Surgeon: Sheral ApleyMurphy, Timothy D, MD;  Location: [facility] OR;  Service: Orthopedics;  Laterality: Right;    Family History: History reviewed. No pertinent family history.  Social History:  reports that she has never smoked. She has never used smokeless tobacco. She reports that she does not drink alcohol or use drugs.  Allergies: No Known Allergies  Medications: I have reviewed the patient's current medications.  ROS: Notable for those difficult described in her history of present illness and past medical history, but a more detailed review of systems is not feasible due to her underlying dementia.  Physical Examination: Elderly white female in no acute distress.  Moderate bruise in the left forehead and supraorbital region.  Immobilized in a Aspen cervical collar. Blood pressure (!) 154/86, pulse (!) 56, temperature 97.8 F (36.6 C), temperature source Oral, resp. rate 16, SpO2 97 %. Lungs: Clear to auscultation, symmetrical respiratory excursion. Heart: Regular rate and rhythm, no murmur. Abdomen: Soft, nondistended, bowel sounds present. Extremity: No clubbing, cyanosis, or edema.  Neurological Examination: Mental Status Examination: Awake and alert, oriented to her name, but not to the year  or city (her son notes that this is her baseline). Cranial Nerve Examination: Pupils equal, round, reactive to light, about 3 mm bilaterally.  EOMI.  Slight droop of the corner of the left side of her  mouth.  Hearing intact.  Symmetrical shoulder shrug.  Tongue midline. Motor Examination: Mild drift of right upper extremity.  Mild weakness of right dorsiflexor.  Plantar flexors with full strength bilaterally.  Exam consistent with mild right hemiparesis. Sensory Examination: Intact in the upper and lower extremities. Reflex Examination:   Minimal, but symmetrical. Gait and Stance Examination: Not tested due to the nature of the patient's condition.   Results for orders placed or performed during the hospital encounter of 12/02/18 (from the past 48 hour(s))  CBC with Differential     Status: Abnormal   Collection Time: 12/02/18 10:27 AM  Result Value Ref Range   WBC 6.4 4.0 - 10.5 K/uL   RBC 3.69 (L) 3.87 - 5.11 MIL/uL   Hemoglobin 10.9 (L) 12.0 - 15.0 g/dL   HCT 16.1 (L) 09.6 - 04.5 %   MCV 92.1 80.0 - 100.0 fL   MCH 29.5 26.0 - 34.0 pg   MCHC 32.1 30.0 - 36.0 g/dL   RDW 40.9 81.1 - 91.4 %   Platelets 292 150 - 400 K/uL   nRBC 0.0 0.0 - 0.2 %   Neutrophils Relative % 70 %   Neutro Abs 4.5 1.7 - 7.7 K/uL   Lymphocytes Relative 20 %   Lymphs Abs 1.3 0.7 - 4.0 K/uL   Monocytes Relative 7 %   Monocytes Absolute 0.5 0.1 - 1.0 K/uL   Eosinophils Relative 2 %   Eosinophils Absolute 0.1 0.0 - 0.5 K/uL   Basophils Relative 1 %   Basophils Absolute 0.0 0.0 - 0.1 K/uL   Immature Granulocytes 0 %   Abs Immature Granulocytes 0.02 0.00 - 0.07 K/uL    Comment: Performed at [facility], [address] [place], [place] [address]  Basic metabolic panel     Status: Abnormal   Collection Time: 12/02/18 10:27 AM  Result Value Ref Range   Sodium 141 135 - 145 mmol/L   Potassium 3.0 (L) 3.5 - 5.1 mmol/L   Chloride 104 98 - 111 mmol/L   CO2 23 22 - 32 mmol/L   Glucose, Bld 101 (H) 70 - 99 mg/dL   BUN 14 8 - 23 mg/dL   Creatinine, Ser 6.21 0.44 - 1.00 mg/dL   Calcium 8.8 (L) 8.9 - 10.3 mg/dL   GFR calc non Af Amer >60 >60 mL/min   GFR calc Af Amer >60 >60 mL/min   Anion gap 14 5 - 15     Comment: Performed at [facility], [address], [place], [place] [address]  Type and screen [facility]     Status: None   Collection Time: 12/02/18 11:15 AM  Result Value Ref Range   ABO/RH(D) O POS    Antibody Screen NEG    Sample Expiration      12/05/2018 Performed at [facility], [address], [place], [place] [address]   APTT     Status: None   Collection Time: 12/02/18 11:15 AM  Result Value Ref Range   aPTT 29 24 - 36 seconds    Comment: Performed at [facility], [address], [place], [place] [address]  Protime-INR     Status: None   Collection Time: 12/02/18 11:15 AM  Result  Value Ref Range   Prothrombin Time 14.1 11.4 - 15.2 seconds   INR 1.10     Comment: Performed at [facility], [address], [place], [place] [address]  ABO/Rh     Status: None   Collection Time: 12/02/18 11:15 AM  Result Value Ref Range   ABO/RH(D) O POS     Ct Head Wo Contrast  Result Date: 12/02/2018 CLINICAL DATA:  Fall, head trauma EXAM: CT HEAD WITHOUT CONTRAST; CT CERVICAL SPINE WITHOUT CONTRAST TECHNIQUE: Contiguous axial images were obtained through the brain and cervical spine without intravenous contrast. COMPARISON:  10/01/2018 FINDINGS: CT HEAD FINDINGS Brain: There are large bilateral mixed attenuation subdural hemorrhages with internal fluid fluid levels, measuring 1.9 cm in maximum coronal thickness on the left and 1.6 cm in maximum coronal thickness on the right. There is substantial mass effect on the bilateral cerebral hemispheres and lateral ventricles without evidence of midline or downward shift. There is underlying small-vessel white matter hypodensity. Vascular: No hyperdense vessel or unexpected calcification. Skull: Normal. Negative for fracture or focal lesion. Sinuses/Orbits: No acute finding. Other: Large left frontal scalp hematoma. CT CERVICAL SPINE FINDINGS Alignment: Normal. Skull base and vertebrae: There is a  minimally displaced type 1 dens fracture (series 13, image 30). No primary bone lesion or focal pathologic process. Soft tissues and spinal canal: No prevertebral fluid or swelling. No visible canal hematoma. Disc levels:  Severe multilevel disc and facet degenerative disease Upper chest: Negative. Other: None. IMPRESSION: 1. There are large bilateral mixed attenuation subdural hemorrhages with internal fluid fluid levels, measuring 1.9 cm in maximum coronal thickness on the left and 1.6 cm in maximum coronal thickness on the right. There is substantial mass effect on the bilateral cerebral hemispheres and lateral ventricles without evidence of midline or downward shift. Although new compared to prior examination dated 10/01/2018, these appear to be acute on subacute to chronic suggesting multiple hemorrhage events in the interval. 2.  Small-vessel white matter disease. 3.  Minimally displaced type 1 dens fracture (series 13, image 30). 4. Severe multilevel disc and facet degenerative disease of the cervical spine. These results were called by telephone at the time of interpretation on 12/02/2018 at 10:57 am to Dr. Doug Sou ; Lorre Nick , who verbally acknowledged these results. Electronically Signed   By: Lauralyn Primes M.D.   On: 12/02/2018 10:57   Ct Cervical Spine Wo Contrast  Result Date: 12/02/2018 CLINICAL DATA:  Fall, head trauma EXAM: CT HEAD WITHOUT CONTRAST; CT CERVICAL SPINE WITHOUT CONTRAST TECHNIQUE: Contiguous axial images were obtained through the brain and cervical spine without intravenous contrast. COMPARISON:  10/01/2018 FINDINGS: CT HEAD FINDINGS Brain: There are large bilateral mixed attenuation subdural hemorrhages with internal fluid fluid levels, measuring 1.9 cm in maximum coronal thickness on the left and 1.6 cm in maximum coronal thickness on the right. There is substantial mass effect on the bilateral cerebral hemispheres and lateral ventricles without evidence of midline or  downward shift. There is underlying small-vessel white matter hypodensity. Vascular: No hyperdense vessel or unexpected calcification. Skull: Normal. Negative for fracture or focal lesion. Sinuses/Orbits: No acute finding. Other: Large left frontal scalp hematoma. CT CERVICAL SPINE FINDINGS Alignment: Normal. Skull base and vertebrae: There is a minimally displaced type 1 dens fracture (series 13, image 30). No primary bone lesion or focal pathologic process. Soft tissues and spinal canal: No prevertebral fluid or swelling. No visible canal hematoma. Disc levels:  Severe multilevel disc and facet degenerative disease Upper  chest: Negative. Other: None. IMPRESSION: 1. There are large bilateral mixed attenuation subdural hemorrhages with internal fluid fluid levels, measuring 1.9 cm in maximum coronal thickness on the left and 1.6 cm in maximum coronal thickness on the right. There is substantial mass effect on the bilateral cerebral hemispheres and lateral ventricles without evidence of midline or downward shift. Although new compared to prior examination dated 10/01/2018, these appear to be acute on subacute to chronic suggesting multiple hemorrhage events in the interval. 2.  Small-vessel white matter disease. 3.  Minimally displaced type 1 dens fracture (series 13, image 30). 4. Severe multilevel disc and facet degenerative disease of the cervical spine. These results were called by telephone at the time of interpretation on 12/02/2018 at 10:57 am to Dr. Doug Sou ; Lorre Nick , who verbally acknowledged these results. Electronically Signed   By: Lauralyn Primes M.D.   On: 12/02/2018 10:57     Assessment/Plan: Elderly female, with baseline dementia (CT brain and C-spine 10/01/2018 shows generalized cerebral atrophy, but no intracranial hemorrhage, and no odontoid fracture), who suffered a trauma from a fall on October 01, 2018.  She has had a decline over the past couple of weeks with difficulties with  speech and walking, and a fall earlier today, who is been found on CT today to have large, bilateral, mixed density subdural hematomas.  Her exam shows a baseline orientation only to name, but does show evidence of a mild right hemiparesis.  I spoke with the patient's son at length at the bedside, and reviewed the patient's CTs from 10/01/2018 as well as today with him.  As regards the type I odontoid fracture, we recommend continued immobilization in the Aspen cervical collar.  We spoke at length regarding options for treatment and care regarding the subdural hematomas.  I explained that the subdural hematomas are unlikely to resolve on their own, but that surgical intervention (bilateral craniotomies for evacuation of subdural hematoma) would be a major undertaking, and would be difficult for the patient to recover from, and would entail significant risk of neurologic decline and increasing dependency.  He explained that she had difficulty with her recovery from the much more minor surgery and anesthetic for the left elbow fracture, and we both are concerned that she would likely have a difficult time recovering from bilateral craniotomies for evacuation of subdural hematoma.  We discussed the nature of the surgery and risks including risks of infection, bleeding, recurrenence of the subdural hematomas and the need for reoperation, increased neurologic deficits including confusion, coma, weakness and/or paralysis, etc., and anesthetic risks of myocardial infarction, stroke, pneumonia, and death.    Certainly at this time she will need an increase in care from assisted living to skilled nursing.  With surgical intervention, she would certainly have an extended hospital course, and require skilled nursing post discharge, and would most likely require skilled nursing indefinitely (that is she is very unlikely to be able to return to assisted living status with or without surgery).  At this time her son Lorin Picket  wants to have an opportunity to discuss his mother's situation with his siblings (a sister in [place] and a brother in [place]) and is hoping that they would be able to come to [place].  I think that is certainly reasonable, and will be available to meet with them later today or early tomorrow morning.    I have discussed my assessment and recommendations, as well as my discussions with the patient's son, with Ms. Albrizze  and Dr. Freida Busman recommended the patient be admitted to the tried hospitalist service for supportive care.  I will continue to follow the patient with them.  Patient's son's questions were answered for him, and I feel he has a good understanding of his mother's condition and the limited options we have for her.  As regards VTE prophylaxis, because of the intracranial hemorrhage, SCDs and not anticoagulants will need to be used.  Hewitt Shorts, MD 12/02/2018, 12:39 PM

## 2018-12-02 NOTE — ED Notes (Signed)
Applied aspen collar to pt

## 2018-12-02 NOTE — ED Notes (Signed)
Pt transported to CT ?

## 2018-12-02 NOTE — H&P (Addendum)
Family Medicine Teaching [facility] Admission History and Physical Service Pager: [phone]  Patient name: Mia Rogers Abair Medical record number: [id number] Date of birth: 1928-01-09 Age: 85 y.o. Gender: female  Primary Care Provider: Manus GunningMazurek, Maggie, FNP Consultants: Neurosurgery Code Status: Full (Per son, reassess in AM)  Chief Complaint: Fall in the setting worsening ambulation and mental status  Assessment and Plan: Mia Rogers Kellen is a 85 y.o. female presenting after an unwitnessed mechanical fall at nursing facility and found to have large bilateral subdural hematomas and a minimally displaced odontoid fracture. PMH is significant for hypothyroidism, essential tremors and dementia.  Subdural Hematoma s/p Mechanical Fall, acute on chronic: S/p unwitnessed fall at assisted living facility with CT head significant for large bilateral mixed density subdural hematomas with mass effect without midline shift. Currently denies headache or vision changes, neuro exam is unremarkable with no focal deficit. Other than hypertension, patient has remained hemodynamically stable and afebrile. Neurosurgery was consulted in the ED, discussion was started with son (HPOA) concerning surgical intervention. He has opted to further discuss options with siblings prior to deciding. Neurosurgery to meet with family in the morning. [facility]  - Admit to FPTS, attending Dr. Manson PasseyBrown --Place patient in progressive care unit - Neurosurgery consulted, appreciate recs, follow up recs pending family discussion - Follow up AM CBC, CMP --Follow up on BNP and troponin  - 75 ml/hr NS mIVF - Tylenol 650 mg q6 prn - Neuro checks q4 hours - Oxygen as needed  Hypertension Elevated blood pressures to 170-180's SBP on admission. Will not agressively treat blood pressure at this time to prevent decrease perfusion to brain.  - Permissive HTN overnight, will further discuss BP  control with surgery in the am  Type 1 Cervical Dens Fracture: CT of the cervical spine significant for a mildly angulated type I odontoid fracture.  - Per neurosurgery, continue immobilization in Aspen cervical collar  Hypokalemia: K 3.0 on admission.  - Repleat with 40mEq IV K+ - Check Mag level - monitor with AM CMP  Hypothyroidism  No TSH available in epic however follows with PCP regularly for TSH labs. Home meds Synthroid 50mcg everyday but Wednesday - Hold home meds while NPO   Dementia: chronic, stable Oriented only to self and only able to answer basic questions. Some reported agitation during last admission that required 2mg  Haldol x 1. Son had family meeting at nursing facility due to medication causing her falls - Seroquel dose was decreased to 12.5mg  (discharged on 09/2018 on 50mg ). Son, [name] [phone]), is HCPOA. Home meds: Aricept 10mg  qHS and Seroquel 12.5mg  - hold home meds while NPO - Consider IV Haldol once if agitated  Essential tremor - patient has a history of essential tremor affecting the head, neck and arms per chart review. No appreciated during exam. Home meds; Propranolol 40mg  qAM - hold home meds while NPO  FEN/GI: NPO, NS 75 cc/hr Prophylaxis: SCDs  Disposition: Pending neurosurgical workup  History of Present Illness:  Mia Rogers [place] is a 85 y.o. female presenting from [facility] assisted [facility] facility in [place] after a fall and found to have a subdermal hematoma. Patient was discharged on 10/06/18 after experiencing a mechanical fall sustaining a maxillary fracture and olecrannon fracture. She [facility] re[facility] where she improved back to baseline per son. She transitioned back to [facility] where she was doing well. Per her son, she did experience several falls that were minor and were felt to not warrant  a trip to the ED. However, he noticed she seemed to have a "harder time getting around, not speaking as well"  starting ~1-2 weeks ago. He thought it may have been from the increase in Seroquel so they decreased the dose. Today the son got a call at ~8am that she had an unwittnessed fall in her room and developed a hematoma to her forehead. She was brought to the ED.  See discharge note from 10/06/18 for details of last admission.  In the ED, patient was noted to have hypertension (170-180's SBP) otherwise hemodynamically stable and afebrile. Labs were significant for K 3.0, Hgb 10.9, Pt/PTT/INR WNL, and UA with 20 ketones, trace leuks, and many bacteria. CT head/cervical spine significant for large bilateral mixed attenuated subdural hemorrhages without evidence of midline or downward shift, appear to be acute on chronic suggesting multiple hemorrhage events as well as a minimally displaced dens type 1 fracture. Neurosurgery was consulted and patient was admitted for supportive care until further discussion with family could be made.  Review Of Systems: Per HPI with the following additions:   Review of Systems  Constitutional: Negative for chills and fever.  Eyes: Positive for blurred vision.  Respiratory: Negative for cough, sputum production and shortness of breath.   Cardiovascular: Negative for chest pain and palpitations.  Gastrointestinal: Negative for abdominal pain, constipation, diarrhea, nausea and vomiting.  Genitourinary: Negative for dysuria.  Neurological: Negative for dizziness, loss of consciousness and headaches.    Patient Active Problem List   Diagnosis Date Noted  . Other encephalopathy   . Fall 10/01/2018  . Closed fracture of right olecranon process     Past Medical History: Past Medical History:  Diagnosis Date  . Dementia (HCC)   . Essential tremor   . Hypothyroid     Past Surgical History: Past Surgical History:  Procedure Laterality Date  . ORIF ELBOW FRACTURE Right 10/02/2018   Procedure: OPEN REDUCTION INTERNAL FIXATION (ORIF) ELBOW/OLECRANON FRACTURE;   Surgeon: Sheral Apley, MD;  Location: [facility] OR;  Service: Orthopedics;  Laterality: Right;    Social History: Social History   Tobacco Use  . Smoking status: Never Smoker  . Smokeless tobacco: Never Used  Substance Use Topics  . Alcohol use: Never    Frequency: Never  . Drug use: Never   Additional social history:  Please also refer to relevant sections of EMR.  Family History: History reviewed. No pertinent family history.  Allergies and Medications: No Known Allergies No current facility-administered medications on file prior to encounter.    Current Outpatient Medications on File Prior to Encounter  Medication Sig Dispense Refill  . acetaminophen (TYLENOL) 500 MG tablet Take 2 tablets (1,000 mg total) by mouth 3 (three) times daily. 30 tablet 0  . donepezil (ARICEPT) 10 MG tablet Take 10 mg by mouth at bedtime.    Marland Kitchen levothyroxine (SYNTHROID, LEVOTHROID) 50 MCG tablet Take 50 mcg by mouth See admin instructions. Take one tablet (50 mcg) by mouth Sunday, Monday, Tuesday, Thursday, Friday Saturday at 6am (skip Wednesday)    . Multiple Vitamin (DAILY VITE) TABS Take 1 tablet by mouth daily.    . propranolol (INDERAL) 40 MG tablet Take 40 mg by mouth daily at 6 (six) AM.    . QUEtiapine (SEROQUEL) 25 MG tablet Take 12.5 mg by mouth at bedtime.       Objective: BP (!) 177/81   Pulse (!) 56   Temp 97.8 F (36.6 C) (Oral)   Resp 16  SpO2 99%  Exam: General: well nourished, well developed, in no acute distress with non-toxic appearance, thin elderly woman with c-collar in place lying comfortably in bed HEENT: moderate hematoma on left forehead and supraorbital region, PERRL, EOMI, moist mucous membranes Neck: immobilized in a Aspen cervical collar CV: regular rate and rhythm without murmurs, rubs, or gallops, no lower extremity edema, 2+ radial and pedal pulses bilaterally Lungs: clear to auscultation bilaterally with normal work of breathing Abdomen: soft, non-tender,  non-distended, normoactive bowel sounds Skin: warm, dry, no rashes or lesions Extremities: warm and well perfused, normal tone Neuro: strength decreased but equal bilaterally, 4/4 sensory bilaterally, Cranial nerve exam benign  Labs and Imaging: CBC BMET  Recent Labs  Lab 12/02/18 1027  WBC 6.4  HGB 10.9*  HCT 34.0*  PLT 292   Recent Labs  Lab 12/02/18 1027  NA 141  K 3.0*  CL 104  CO2 23  BUN 14  CREATININE 0.79  GLUCOSE 101*  CALCIUM 8.8*     Urinalysis    Component Value Date/Time   COLORURINE YELLOW 12/02/2018 1247   APPEARANCEUR HAZY (A) 12/02/2018 1247   LABSPEC 1.014 12/02/2018 1247   PHURINE 6.0 12/02/2018 1247   GLUCOSEU NEGATIVE 12/02/2018 1247   HGBUR NEGATIVE 12/02/2018 1247   BILIRUBINUR NEGATIVE 12/02/2018 1247   KETONESUR 20 (A) 12/02/2018 1247   PROTEINUR NEGATIVE 12/02/2018 1247   NITRITE NEGATIVE 12/02/2018 1247   LEUKOCYTESUR TRACE (A) 12/02/2018 1247    Ct Head and spine   1. There are large bilateral mixed attenuation subdural hemorrhages with internal fluid fluid levels, measuring 1.9 cm in maximum coronal thickness on the left and 1.6 cm in maximum coronal thickness on the right. There is substantial mass effect on the bilateral cerebral hemispheres and lateral ventricles without evidence of midline or downward shift. Although new compared to prior examination dated 10/01/2018, these appear to be acute on subacute to chronic suggesting multiple hemorrhage events in the interval. 2.  Small-vessel white matter disease. 3.  Minimally displaced type 1 dens fracture (series 13, image 30). 4. Severe multilevel disc and facet degenerative disease of the cervical spine.   I have seen and evaluated the patient with Dr. Mauri ReadingMullis. I am in agreement with the note above in its revised form. My additions are in blue.  Lovena NeighboursAbdoulaye Gowri Suchan, MD Family Medicine, PGY-3   Joana ReamerMullis, Kiersten P, DO 12/02/2018, 2:40 PM PGY-1, [facility] Family Medicine FPTS Intern  pager: [phone], text pages welcome

## 2018-12-02 NOTE — Progress Notes (Signed)
Pt arrived to unit, A+O to self, VSS BP 192/91 P 63 R 21 O2 @ 100% on RA. Dr. Sabino Gasser notified, advised this RN to notify MD w/SBP >180, no new rx orders received.

## 2018-12-02 NOTE — ED Triage Notes (Signed)
Pt had witnessed fall at [facility] nursing home. Pt fell forward onto bed and slid to floor, hitting face on floor. Pt alert at her mental baseline per EMS. Pt has dementia. Pt has been more lethargic lately per EMS due to medication changes. Pt has had a lot of falls as well. BP 160 systolic, 97% on room air, HR 60. Pt is not on blood thinners. Large hematoma to left forehead.

## 2018-12-02 NOTE — ED Notes (Signed)
Admitting at bedside; aware of pt's BP increasing. No admitting orders at this time.

## 2018-12-03 ENCOUNTER — Other Ambulatory Visit: Payer: Self-pay

## 2018-12-03 DIAGNOSIS — Z7189 Other specified counseling: Secondary | ICD-10-CM

## 2018-12-03 DIAGNOSIS — F05 Delirium due to known physiological condition: Secondary | ICD-10-CM

## 2018-12-03 DIAGNOSIS — F0391 Unspecified dementia with behavioral disturbance: Secondary | ICD-10-CM

## 2018-12-03 LAB — BASIC METABOLIC PANEL
ANION GAP: 19 — AB (ref 5–15)
BUN: 8 mg/dL (ref 8–23)
CO2: 18 mmol/L — ABNORMAL LOW (ref 22–32)
Calcium: 8.9 mg/dL (ref 8.9–10.3)
Chloride: 103 mmol/L (ref 98–111)
Creatinine, Ser: 0.8 mg/dL (ref 0.44–1.00)
GFR calc Af Amer: >60 mL/min (ref >60)
GFR calc non Af Amer: >60 mL/min (ref >60)
Glucose, Bld: 106 mg/dL — ABNORMAL HIGH (ref 70–99)
Potassium: 3.3 mmol/L — ABNORMAL LOW (ref 3.5–5.1)
Sodium: 140 mmol/L (ref 135–145)

## 2018-12-03 LAB — CBC
HCT: 37.3 % (ref 36.0–46.0)
Hemoglobin: 12.1 g/dL (ref 12.0–15.0)
MCH: 29.6 pg (ref 26.0–34.0)
MCHC: 32.4 g/dL (ref 30.0–36.0)
MCV: 91.2 fL (ref 80.0–100.0)
NRBC: 0 % (ref 0.0–0.2)
Platelets: 311 10*3/uL (ref 150–400)
RBC: 4.09 MIL/uL (ref 3.87–5.11)
RDW: 14.9 % (ref 11.5–15.5)
WBC: 9.2 10*3/uL (ref 4.0–10.5)

## 2018-12-03 MED ORDER — POTASSIUM CHLORIDE 10 MEQ/100ML IV SOLN
10.0000 meq | INTRAVENOUS | Status: AC
  Administered 2018-12-03 (×2): 10 meq via INTRAVENOUS
  Filled 2018-12-03 (×2): qty 100

## 2018-12-03 MED ORDER — ACETAMINOPHEN 325 MG PO TABS
325.0000 mg | ORAL_TABLET | Freq: Four times a day (QID) | ORAL | Status: DC
  Administered 2018-12-03 – 2018-12-04 (×4): 325 mg via ORAL
  Filled 2018-12-03 (×4): qty 1

## 2018-12-03 MED ORDER — LEVOTHYROXINE SODIUM 50 MCG PO TABS
50.0000 ug | ORAL_TABLET | ORAL | Status: DC
  Administered 2018-12-03 – 2018-12-04 (×2): 50 ug via ORAL
  Filled 2018-12-03 (×2): qty 1

## 2018-12-03 MED ORDER — ACETAMINOPHEN 650 MG RE SUPP
325.0000 mg | Freq: Four times a day (QID) | RECTAL | Status: DC
  Administered 2018-12-03: 325 mg via RECTAL
  Filled 2018-12-03: qty 1

## 2018-12-03 MED ORDER — SODIUM CHLORIDE 0.9 % IV SOLN
INTRAVENOUS | Status: DC
  Administered 2018-12-03: 11:00:00 via INTRAVENOUS

## 2018-12-03 MED ORDER — MAGNESIUM SULFATE 2 GM/50ML IV SOLN
2.0000 g | Freq: Once | INTRAVENOUS | Status: AC
  Administered 2018-12-03: 2 g via INTRAVENOUS
  Filled 2018-12-03: qty 50

## 2018-12-03 NOTE — Progress Notes (Signed)
On call Family Medicine notified of increasing BP, restlessness, decrease responsiveness,  not following commands.  No new orders, continue permissive HTN.

## 2018-12-03 NOTE — Evaluation (Signed)
Clinical/Bedside Swallow Evaluation Patient Details  Name: Mia Rogers MRN: [id number] Date of Birth: [birth year]  Today's Date: 12/03/2018 Time: SLP Start Time (ACUTE ONLY): 1115 SLP Stop Time (ACUTE ONLY): 83 SLP Time Calculation (min) (ACUTE ONLY): 20 min  Past Medical History:  Past Medical History:  Diagnosis Date  . Dementia (HCC)   . Essential tremor   . Hypothyroid    Past Surgical History:  Past Surgical History:  Procedure Laterality Date  . ORIF ELBOW FRACTURE Right 10/02/2018   Procedure: OPEN REDUCTION INTERNAL FIXATION (ORIF) ELBOW/OLECRANON FRACTURE;  Surgeon: Sheral Apley, MD;  Location: [facility] OR;  Service: Orthopedics;  Laterality: Right;   HPI:  Pt is an 85 year old woman with history of cognitive impairment, dementia, hypothyroidism, and recent admission for olecranon fracture and maxillary fracture (12/19) who presented to the ED after an unwitnessed mechanical fall at nursing facility. CT head was significant for large bilateral mixed density subdural hematomas with mass effect without midline shift. [name] family has opted not to pursue neurosurgery given risks/benefits and unlikely substantial improvement with surgery.   Assessment / Plan / Recommendation Clinical Impression  Pt was seen for bedside swallow evaluation with her children present. The pt's son denied the pt having any symptoms oropharyngeal dysphagia prior to admission and stated that "ate whatever she wanted once it tastes good". She was adequately alert throughout the session but demonstrated increasing difficulty maintaining alertness as the session progressed. Pt tolerated puree solids and thin liquids via cup without overt s/sx of aspiration. More advanced solids were deferred due to pt's waning alertness and since the cervical collar reduced her ability to fully open her mouth. Pt demonstrated a oral hold with all trials and an  oral/pharyngeal delay is questioned. It is recommended that a  dysphagia 1 diet with thin liquids be initiated at this time and SLP will follow to assess tolerance of the recommended diet.  SLP Visit Diagnosis: Dysphagia, unspecified (R13.10)    Aspiration Risk  Mild aspiration risk(Due to alertness and need for assisstance from staff)    Diet Recommendation Dysphagia 1 (Puree);Thin liquid   Liquid Administration via: Cup;No straw Medication Administration: Crushed with puree Supervision: Comment(Pt will likely require feeding from staff) Compensations: Small sips/bites;Slow rate Postural Changes: Seated upright at 90 degrees;Remain upright for at least 30 minutes after po intake    Other  Recommendations Oral Care Recommendations: Oral care BID   Follow up Recommendations 24 hour supervision/assistance      Frequency and Duration min 2x/week  2 weeks       Prognosis Prognosis for Safe Diet Advancement: Fair Barriers to Reach Goals: Cognitive deficits;Behavior      Swallow Study   General Date of Onset: 12/02/18 HPI: Pt is an 85 year old woman with history of cognitive impairment, dementia, hypothyroidism, and recent admission for olecranon fracture and maxillary fracture (12/19) who presented to the ED after an unwitnessed mechanical fall at nursing facility. CT head was significant for large bilateral mixed density subdural hematomas with mass effect without midline shift. [name] family has opted not to pursue neurosurgery given risks/benefits and unlikely substantial improvement with surgery. Type of Study: Bedside Swallow Evaluation Previous Swallow Assessment: None Diet Prior to this Study: NPO Temperature Spikes Noted: No Respiratory Status: Room air History of Recent Intubation: No Behavior/Cognition: Alert;Cooperative;Pleasant mood(Pt's alertness was variable ) Oral Cavity Assessment: Within Functional Limits Oral Care Completed by SLP: No Oral Cavity - Dentition: Adequate natural dentition Self-Feeding Abilities: Total  assist  Patient Positioning: Upright in bed;Postural control adequate for testing Baseline Vocal Quality: Normal Volitional Cough: Cognitively unable to elicit Volitional Swallow: Able to elicit    Oral/Motor/Sensory Function Overall Oral Motor/Sensory Function: Within functional limits   Ice Chips Ice chips: Within functional limits Presentation: Spoon   Thin Liquid Thin Liquid: Impaired Presentation: Cup;Spoon Oral Phase Functional Implications: Oral holding Pharyngeal  Phase Impairments: Suspected delayed Swallow(No s/sx of aspiration)    Nectar Thick Nectar Thick Liquid: Not tested   Honey Thick Honey Thick Liquid: Not tested   Puree Puree: Impaired Presentation: Spoon Oral Phase Functional Implications: Oral holding Pharyngeal Phase Impairments: Suspected delayed Swallow   Solid    Mia Rogers Clock, MS, CCC-SLP Acute Rehabilitation Services Office number [phone] Pager [phone] Solid: Not tested      Scheryl Marten 12/03/2018,1:36 PM

## 2018-12-03 NOTE — Progress Notes (Signed)
Subjective: Patient resting comfortably in bed.  Had some restlessness overnight according to the family.  Her son Lorin Picket who lives in [place] as well as her son Minerva Areola from [place] and her daughter Melody from [place] were at the bedside.  Objective: Vital signs in last 24 hours: Vitals:   12/03/18 0012 12/03/18 0200 12/03/18 0412 12/03/18 0815  BP: (!) 188/113 (!) 176/79 (!) 164/91 (!) 144/85  Pulse: 74 87 66 73  Resp: 17 19 17  (!) 25  Temp: 98 F (36.7 C) 98.6 F (37 C) 97.6 F (36.4 C)   TempSrc: Oral Oral Axillary   SpO2: 98% 97% 98% 99%  Weight:      Height:        Intake/Output from previous day: 02/06 0701 - 02/07 0700 In: 1229.5 [I.V.:404.5; IV Piggyback:825] Out: -  Intake/Output this shift: No intake/output data recorded.  Physical Exam: Awake and oriented to her name only.  Moving all 4 extremities, but not following commands.  CBC Recent Labs    12/02/18 1027 12/03/18 0716  WBC 6.4 9.2  HGB 10.9* 12.1  HCT 34.0* 37.3  PLT 292 311   BMET Recent Labs    12/02/18 1027 12/03/18 0716  NA 141 140  K 3.0* 3.3*  CL 104 103  CO2 23 18*  GLUCOSE 101* 106*  BUN 14 8  CREATININE 0.79 0.80  CALCIUM 8.8* 8.9    Assessment/Plan: Spoke with the patient's children at length, reviewing her CT results, neurologic condition, and options for treatment and care from a neurosurgical perspective, reiterating my discussion with her son Lorin Picket yesterday.  Certainly her dependencies have increased, and I do not anticipate her returning to a assisted living status with or without surgical invention.  It is likely that with surgery there will be neurologic decline and increased disability, and certainly moderate risk of reaccumulation of the subdural hematomas and potential need for reoperation.  All 3 of her children understand the very limited potential benefit of surgical intervention and the significant likelihood of neurologic decline and encountering significant  perioperative complications.  After a lengthy discussion, at this time they do not want [place] to consider surgical invention, but rather comfort care, and ultimately skilled nursing facility placement.  I have left a message for Dr. Mauri Reading (from family practice) to call me regarding the discussion that I had with the patient's family and the recommendations from a neurosurgical perspective.  Hewitt Shorts, MD 12/03/2018, 9:08 AM

## 2018-12-03 NOTE — Progress Notes (Signed)
FPTS Interim Progress Note  Spoke to neurosurgeon Dr. Newell Coral after family discussion. After lengthy discussion, family has opted not to pursue surgery given risks/benefits and unlikely substantial improvement with surgery. Patient will likely need skilled nursing going forward and GOC will need to be discussed, including code status going forward. Patient has been NPO and likely will not cooperate with swallow study, thus diet will need to be addressed as well. Discussed symptom management rather than further imaging if changes in neurologic/mental exam given decision not to pursue surgical intervention. Appreciate Dr. Earl Gala consultation and time spent with the family. He plans to see patient again on Monday 12/06/18.  Orpah Cobb [place], DO 12/03/2018, 9:29 AM PGY-1, [facility] Family Medicine Service pager [phone]

## 2018-12-03 NOTE — Progress Notes (Signed)
Family Medicine Teaching Service Daily Progress Note Intern Pager: [phone]  Patient name: Mia Rogers Medical record number: [id number] Date of birth: 14-Feb-1928 Age: 85 y.o. Gender: female  Primary Care Provider: Manus GunningMazurek, Maggie, FNP Consultants: Neurosurgery Code Status: Full (to be readdressed)  Pt Overview and Major Events to Date:  2/6: Admitted to FPTS, CT head/neck w/o contrast with bilateral mixed density subdural hematomas and mild odontoid fracture  2/7: Family opted not to pursue surgical intervention  Assessment and Plan: Mia ReddenBonnie Rogers is a 85 y.o. female presenting after an unwitnessed mechanical fall at nursing facility and found to have large bilateral subdural hematomas and a minimally displaced odontoid fracture. PMH is significant forhypothyroidism, essential tremors and dementia.  Subdural Hematoma s/p Mechanical Fall, acute on chronic: Vomited two separate times when being manipulated and turned by nurses. Some agitation but improved after a dose of tylenol. Expect she may have been in pain and unable to voice this given baseline mental status and hematoma. Hemodynamically stable overnight. Permissive HTN overnight with improvement to 160's systolic this AM. Patient awake and alert x 1 (to self) but not following one step commands (somewhat declined from admission). BNP and Troponin WNL. CBC and CMP WNL, except for K 3.3. Children Mia Rogers(Mia Rogers, Mia AreolaEric, and Mia Rogers) at bedside this morning. Mia Rogers spoke to family during exam. - Follow up family discussion concerning treatment - Neuro consulted, appreciate recs - 75 ml/hr NS mIVF - Schedule Tylenol 350mg  suppository q6 hours, max dose per neurosurg <2g/day - Neuro checks q4 hours - Oxygen as needed - SLP eval and diet pending recs - Palliative consult for GOC discussion - Will discuss Code status with family  Hypertension: improving Permissive HTN overnight, max 192/91. Improved BP this AM 164/91. - Continue to  monitor   Hypokalemia: K+ 3.0 on admission, 3.3 this AM s/p 40mEq IV K+. Mag 1.8. - Replete K+ 20mEq IV and 2g Mag  Type 1 Cervical Dens Fracture: CT of the cervical spine significant for a mildly angulated type I odontoid fracture.  - continue immobilization in Aspen cervical collar  Hypothyroidism Follows with PCP regularly for TSH labs. Home meds Synthroid 50mcg everyday but Wednesday - Hold home meds pending SLP eval  Dementia: chronic, stable Oriented only to self, does not follow simple commands this morning (able to follow 1-step commands on admission). Some reported agitation ON that improved with tylenol. Expect some pain may have been contributing. Son, Mia Rogers [phone]), is HCPOA. Home meds: Aricept 10mg  qHS and Seroquel 12.5mg  - hold home meds pending SLP eval - scheduled tylenol for pain - Consider IV Haldol x1  if agitated  Essentialtremor- patient has a history of essential tremor affecting the head, neck and arms per chart review. Appreciated on exam this AM. Home meds; Propranolol 40mg  qAM - hold home meds pending SLP eval  FEN/GI: NPO pending SLP eval PPx: SCDs  Disposition: Pending GOC discussion  Subjective:  Family at bedside. Patient appears comfortable. Patient did not answer questions regarding comfort or pain this morning.    Objective: Temp:  [97.6 F (36.4 C)-98.7 F (37.1 C)] 97.6 F (36.4 C) (02/07 0412) Pulse Rate:  [54-87] 73 (02/07 0815) Resp:  [14-25] 25 (02/07 0815) BP: (144-192)/(75-113) 144/85 (02/07 0815) SpO2:  [97 %-100 %] 99 % (02/07 0815) Weight:  [58 kg] 58 kg (02/06 2000) Physical Exam: General: well nourished, well developed, in no acute distress with non-toxic appearance, lying comfortably in bed with cervical collar in place HEENT: normocephalic, atraumatic, moist mucous membranes,  PERRL Neck: cervical collar in place CV: regular rate and rhythm without murmurs, rubs, or gallops, no lower extremity edema, 2+  radial and pedal pulses, no edema appreciated Lungs: clear to auscultation bilaterally with normal work of breathing Abdomen: soft, non-tender, non-distended, normoactive bowel sounds Skin: warm, dry, no rashes or lesions Extremities: warm and well perfused Neuro: Awake and oriented to self, moving all extremities but did not follow 1-step commands - just stares at you, PERRL, speech normal, did not respond to most questions when asked  Laboratory: Recent Labs  Lab 12/02/18 1027 12/03/18 0716  WBC 6.4 9.2  HGB 10.9* 12.1  HCT 34.0* 37.3  PLT 292 311   Recent Labs  Lab 12/02/18 1027 12/03/18 0716  NA 141 140  K 3.0* 3.3*  CL 104 103  CO2 23 18*  BUN 14 8  CREATININE 0.79 0.80  CALCIUM 8.8* 8.9  GLUCOSE 101* 106*   PT/INR: 14.1/1.10 PTT: 29 Mag 1.8 BNP: 126 Trop: <0.03  Urinalyisis  COLORURINE YELLOW 12/02/2018 1247   APPEARANCEUR HAZY (A) 12/02/2018 1247   LABSPEC 1.014 12/02/2018 1247   PHURINE 6.0 12/02/2018 1247   GLUCOSEU NEGATIVE 12/02/2018 1247   HGBUR NEGATIVE 12/02/2018 1247   BILIRUBINUR NEGATIVE 12/02/2018 1247   KETONESUR 20 (A) 12/02/2018 1247   PROTEINUR NEGATIVE 12/02/2018 1247   NITRITE NEGATIVE 12/02/2018 1247   LEUKOCYTESUR TRACE (A) 12/02/2018 1247   Imaging/Diagnostic Tests: Ct Head and Cervical Spine Wo Contrast -  12/02/2018 IMPRESSION: 1. There are large bilateral mixed attenuation subdural hemorrhages with internal fluid fluid levels, measuring 1.9 cm in maximum coronal thickness on the left and 1.6 cm in maximum coronal thickness on the right. There is substantial mass effect on the bilateral cerebral hemispheres and lateral ventricles without evidence of midline or downward shift. Although new compared to prior examination dated 10/01/2018, these appear to be acute on subacute to chronic suggesting multiple hemorrhage events in the interval. 2.  Small-vessel white matter disease. 3.  Minimally displaced type 1 dens fracture (series  13, image 30). 4. Severe multilevel disc and facet degenerative disease of the cervical spine.     Mia Cobb [place], DO 12/03/2018, 12:29 PM PGY-1, [facility] Family Medicine FPTS Intern pager: [phone], text pages welcome

## 2018-12-03 NOTE — Consult Note (Signed)
Consultation Note Date: 12/03/2018   Patient Name: Mia Rogers  DOB: [birth year]  MRN: [id number]  Age / Sex: 85 y.o., female  PCP: Manus GunningMazurek, Maggie, FNP Referring Physician: Westley ChandlerBrown, Carina M, MD  Reason for Consultation: Establishing goals of care  HPI/Patient Profile: Mia BasqueBonnie Donovanis a 85 y.o.femalepresenting after an unwitnessedmechanical fall at nursing facilityand found to have large bilateralsubdural hematomas andaminimally displacedodontoid fracture. Non op management planned for bilateral SDH, and Aspen collar for odontoid fx.  .  Clinical Assessment and Goals of Care: Patient is resting in bed with staff feeding her. Son is at bedside. She has eaten bites and sips. We discussed her quality of life which has wax and waned since December, but ultimately declined. 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    We discussed her diagnosis, prognosis, GOC, EOL wishes disposition and options.  A detailed discussion was had today regarding advanced directives.  Concepts specific to code status, artifical feeding and hydration, IV antibiotics and rehospitalization were discussed.  The difference between an aggressive medical intervention path and a comfort care path was discussed.  Values and goals of care important to patient and family were attempted to be elicited.  Discussed limitations of medical interventions to prolong quality of life for this patient at this time in this situation and discussed the concept of human mortality. Natural trajectory and expectations at EOL were discussed.  Questions and concerns addressed.     He states the surgeon has done a great job explaining her status, and will speak with him again Monday. Lorin PicketScott tells me he understands his mother's very poor prognosis and does not want his mother to suffer or have a  poor quality of life.   He states he is the HCPOA for his mother, and has spoken with his 2 siblings who are leaning toward comfort care. He states they are a close family and though he is POA, they need to be in agreement. Offered to speak to the siblings if they would like as they are not listed in demographics, and asked that he bring a copy of POA papers to make a copy for the chart. He states his siblings may call if they have questins We discussed code status which he would like to leave full code until speaking with his family.   Will follow up tomorrow.    Son states he is the [employer]CP[employer]. Asked that he bring a copy of the form to scan into chart.     SUMMARY OF RECOMMENDATIONS    Follow up tomorrow.   Prognosis:   Very poor       Primary Diagnoses: Present on Admission: . Subdural hematoma (HCC)   I have reviewed the medical record, interviewed the patient and family, and examined the patient. The following aspects are pertinent.  Past Medical History:  Diagnosis Date  . Dementia (HCC)   . Essential tremor   . Hypothyroid    Social History   Socioeconomic  History  . Marital status: Unknown    Spouse name: Not on file  . Number of children: Not on file  . Years of education: Not on file  . Highest education level: Not on file  Occupational History  . Not on file  Social Needs  . Financial resource strain: Not on file  . Food insecurity:    Worry: Not on file    Inability: Not on file  . Transportation needs:    Medical: Not on file    Non-medical: Not on file  Tobacco Use  . Smoking status: Never Smoker  . Smokeless tobacco: Never Used  Substance and Sexual Activity  . Alcohol use: Never    Frequency: Never  . Drug use: Never  . Sexual activity: Not on file  Lifestyle  . Physical activity:    Days per week: Not on file    Minutes per session: Not on file  . Stress: Not on file  Relationships  . Social connections:    Talks on phone: Not on file     Gets together: Not on file    Attends religious service: Not on file    Active member of club or organization: Not on file    Attends meetings of clubs or organizations: Not on file    Relationship status: Not on file  Other Topics Concern  . Not on file  Social History Narrative  . Not on file   History reviewed. No pertinent family history. Scheduled Meds: . acetaminophen  325 mg Rectal Q6H  . levothyroxine  50 mcg Oral Once per day on Sun Mon Tue Thu Fri Sat   Continuous Infusions: PRN Meds:. Medications Prior to Admission:  Prior to Admission medications   Medication Sig Start Date End Date Taking? Authorizing Provider  acetaminophen (TYLENOL) 500 MG tablet Take 2 tablets (1,000 mg total) by mouth 3 (three) times daily. 10/06/18  Yes Sandre Kitty, MD  donepezil (ARICEPT) 10 MG tablet Take 10 mg by mouth at bedtime.   Yes [provider]  levothyroxine (SYNTHROID, LEVOTHROID) 50 MCG tablet Take 50 mcg by mouth See admin instructions. Take one tablet (50 mcg) by mouth Sunday, Monday, Tuesday, Thursday, Friday Saturday at 6am (skip Wednesday)   Yes [provider]  Multiple Vitamin (DAILY VITE) TABS Take 1 tablet by mouth daily.   Yes [provider]  propranolol (INDERAL) 40 MG tablet Take 40 mg by mouth daily at 6 (six) AM.   Yes [provider]  QUEtiapine (SEROQUEL) 25 MG tablet Take 12.5 mg by mouth at bedtime.    Yes [provider]   No Known Allergies Review of Systems  Unable to perform ROS   Physical Exam Pulmonary:     Effort: Pulmonary effort is normal.  Neurological:     Mental Status: She is alert.     Vital Signs: BP (!) 170/104 (BP Location: Right Arm)   Pulse (!) 104   Temp 98.7 F (37.1 C) (Oral)   Resp (!) 27   Ht 5\' 7"  (1.702 m)   Wt 58 kg   SpO2 99%   BMI 20.03 kg/m  Pain Scale: 0-10   Pain Score: Asleep   SpO2: SpO2: 99 % O2 Device:SpO2: 99 % O2 Flow Rate: .   IO: Intake/output summary:     Intake/Output Summary (Last 24 hours) at 12/03/2018 1627 Last data filed at 12/03/2018 1523 Gross per 24 hour  Intake 1523.77 ml  Output 700 ml  Net  823.77 ml    LBM: Last BM Date: 12/03/18 Baseline Weight: Weight: 58 kg Most recent weight: Weight: 58 kg     Palliative Assessment/Data:     Time In:3:40 Time Out: 4:30 Time Total: 50 min Greater than 50%  of this time was spent counseling and coordinating care related to the above assessment and plan.  Signed by: Morton Stall, NP   Please contact Palliative Medicine Team phone at [phone] for questions and concerns.  For individual provider: See Loretha Stapler

## 2018-12-03 NOTE — Plan of Care (Signed)
Patient stable, discussed POC with patient and family, agreeable with plan, denies question/concerns at this time.  

## 2018-12-04 LAB — CBC
HCT: 35.9 % — ABNORMAL LOW (ref 36.0–46.0)
Hemoglobin: 11.4 g/dL — ABNORMAL LOW (ref 12.0–15.0)
MCH: 28.9 pg (ref 26.0–34.0)
MCHC: 31.8 g/dL (ref 30.0–36.0)
MCV: 91.1 fL (ref 80.0–100.0)
Platelets: 281 10*3/uL (ref 150–400)
RBC: 3.94 MIL/uL (ref 3.87–5.11)
RDW: 15 % (ref 11.5–15.5)
WBC: 7.3 10*3/uL (ref 4.0–10.5)
nRBC: 0 % (ref 0.0–0.2)

## 2018-12-04 LAB — BASIC METABOLIC PANEL
ANION GAP: 13 (ref 5–15)
BUN: 7 mg/dL — ABNORMAL LOW (ref 8–23)
CO2: 18 mmol/L — ABNORMAL LOW (ref 22–32)
Calcium: 8.4 mg/dL — ABNORMAL LOW (ref 8.9–10.3)
Chloride: 106 mmol/L (ref 98–111)
Creatinine, Ser: 0.76 mg/dL (ref 0.44–1.00)
GFR calc Af Amer: >60 mL/min (ref >60)
GFR calc non Af Amer: >60 mL/min (ref >60)
Glucose, Bld: 104 mg/dL — ABNORMAL HIGH (ref 70–99)
Potassium: 3.2 mmol/L — ABNORMAL LOW (ref 3.5–5.1)
Sodium: 137 mmol/L (ref 135–145)

## 2018-12-04 MED ORDER — PROPRANOLOL HCL 40 MG PO TABS
40.0000 mg | ORAL_TABLET | Freq: Every day | ORAL | Status: DC
  Filled 2018-12-04: qty 1

## 2018-12-04 MED ORDER — PROPRANOLOL HCL 40 MG PO TABS
40.0000 mg | ORAL_TABLET | Freq: Every day | ORAL | Status: DC
  Administered 2018-12-04: 40 mg via ORAL
  Filled 2018-12-04 (×3): qty 1

## 2018-12-04 MED ORDER — POTASSIUM CHLORIDE CRYS ER 20 MEQ PO TBCR
40.0000 meq | EXTENDED_RELEASE_TABLET | Freq: Once | ORAL | Status: AC
  Administered 2018-12-04: 40 meq via ORAL
  Filled 2018-12-04: qty 2

## 2018-12-04 MED ORDER — LORAZEPAM 2 MG/ML IJ SOLN: 0.5000 mg | INTRAMUSCULAR | Status: DC | PRN

## 2018-12-04 MED ORDER — MORPHINE SULFATE (PF) 2 MG/ML IV SOLN
1.0000 mg | INTRAVENOUS | Status: DC | PRN
  Administered 2018-12-04: 1 mg via INTRAVENOUS
  Filled 2018-12-04: qty 1

## 2018-12-04 MED ORDER — MORPHINE SULFATE (PF) 2 MG/ML IV SOLN: 1.0000 mg | INTRAVENOUS | Status: DC | PRN

## 2018-12-04 MED ORDER — DONEPEZIL HCL 10 MG PO TABS: 10.0000 mg | ORAL_TABLET | Freq: Every day | ORAL | Status: DC

## 2018-12-04 NOTE — Progress Notes (Signed)
  NEUROSURGERY PROGRESS NOTE   Son at bedside reports much better night with improved rest. Patient sitting upright, awake  EXAM:  BP (!) 174/97 (BP Location: Right Arm) Comment: RN aware  Pulse 95   Temp 97.7 F (36.5 C) (Oral)   Resp 13   Ht 5\' 7"  (1.702 m)   Wt 58 kg   SpO2 98%   BMI 20.03 kg/m   Awake sitting upright Does not follow commands Moves extremities spontaneously  PLAN Much more alert compared to yesterday, although discussed possible waxing/waning of alertness.  Patient is comfort care measures only and will be DNR. No new NS recs.

## 2018-12-04 NOTE — Progress Notes (Signed)
Spoke with patient's son last night regarding code status.  He states he and the other children would like her to be DNR.  He states he is HCPOA.  Notified son that he will need to provide HCPOA documentation or confirm in person with one more sibling before making her DNR.  He expressed his understanding and stated he bring proof in the AM.

## 2018-12-04 NOTE — Evaluation (Signed)
Physical Therapy Evaluation Patient Details Name: Mia ReddenBonnie Rogers MRN: [id number] DOB: [birth year] Today's Date: 12/04/2018   History of Present Illness  Pt is an 85 y/o female presenting after an unwitnessed mechanical fall at a nursing facility and found to have large bilateral subdural hematomas and a minimally displaced odontoid fracture. Neurosurgery was consulted and recommended hard cervical collar. PMH is significant for hypothyroidism, essential tremors and dementia.    Clinical Impression  Pt presented supine in bed with HOB elevated, cervical collar in place with family present. Pt lethargic throughout with brief periods of alertness. PT discussed with pt's son and granddaughter at length regarding family's wishes moving forward with therapy services. [name] requesting that therapy services continue to check in with them as they continue to make decisions about pt's disposition and overall goals for her. PT and son repositioned pt in bed with use of bed pads (total A x2). When pt was alert, she denied pain, smiled and laughed. PT will continue to follow pt acutely as long as family would like for therapy services to be involved.   All VSS throughout.     Follow Up Recommendations SNF;Other (comment)(SNF with Hospice)    Equipment Recommendations  None recommended by PT    Recommendations for Other Services       Precautions / Restrictions Precautions Precautions: Fall;Cervical Required Braces or Orthoses: Cervical Brace Cervical Brace: Hard collar;At all times Restrictions Weight Bearing Restrictions: No      Mobility  Bed Mobility Overal bed mobility: Needs Assistance             General bed mobility comments: total A x2 with use of bed pads to position pt in bed; pt not assisting with any part of mobility; pt restless in bed, shifting her trunk and moving bilateral LEs spontaneously  Transfers                    Ambulation/Gait                Stairs            Wheelchair Mobility    Modified Rankin (Stroke Patients Only)       Balance Overall balance assessment: History of Falls                                           Pertinent Vitals/Pain Pain Assessment: No/denies pain    Home Living Family/patient expects to be discharged to:: Assisted living               Home Equipment: None Additional Comments: [facility] - prior to falls pt was independent with ambulation and ADLs. The ALF was assisting with meds management    Prior Function Level of Independence: Independent         Comments: enjoyed walking her dog     Hand Dominance   Dominant Hand: Right    Extremity/Trunk Assessment   Upper Extremity Assessment Upper Extremity Assessment: Difficult to assess due to impaired cognition;Defer to OT evaluation;Generalized weakness    Lower Extremity Assessment Lower Extremity Assessment: Difficult to assess due to impaired cognition;Generalized weakness    Cervical / Trunk Assessment Cervical / Trunk Assessment: Other exceptions Cervical / Trunk Exceptions: cervical dens fx  Communication   Communication: No difficulties  Cognition Arousal/Alertness: Lethargic Behavior During Therapy: Restless Overall Cognitive Status: History of cognitive impairments -  at baseline                                 General Comments: pt with dementia at baseline; recognized family members in room      General Comments      Exercises     Assessment/Plan    PT Assessment Patient needs continued PT services  PT Problem List Decreased strength;Decreased range of motion;Decreased activity tolerance;Decreased balance;Decreased mobility;Decreased coordination;Decreased cognition;Decreased knowledge of use of DME;Decreased safety awareness;Decreased knowledge of precautions       PT Treatment Interventions DME instruction;Functional mobility training;Therapeutic  activities;Therapeutic exercise;Balance training;Neuromuscular re-education;Patient/family education    PT Goals (Current goals can be found in the Care Plan section)  Acute Rehab PT Goals Patient Stated Goal: unable to state PT Goal Formulation: With family Time For Goal Achievement: 12/18/18 Potential to Achieve Goals: Fair    Frequency Min 2X/week   Barriers to discharge        Co-evaluation               AM-PAC PT "6 Clicks" Mobility  Outcome Measure Help needed turning from your back to your side while in a flat bed without using bedrails?: Total Help needed moving from lying on your back to sitting on the side of a flat bed without using bedrails?: Total Help needed moving to and from a bed to a chair (including a wheelchair)?: Total Help needed standing up from a chair using your arms (e.g., wheelchair or bedside chair)?: Total Help needed to walk in hospital room?: Total Help needed climbing 3-5 steps with a railing? : Total 6 Click Score: 6    End of Session Equipment Utilized During Treatment: Cervical collar Activity Tolerance: Patient limited by fatigue Patient left: in bed;with call bell/phone within reach;with family/visitor present;with SCD's reapplied Nurse Communication: Mobility status PT Visit Diagnosis: History of falling (Z91.81);Other abnormalities of gait and mobility (R26.89)    Time: 3212-2482 PT Time Calculation (min) (ACUTE ONLY): 33 min   Charges:   PT Evaluation $PT Eval Moderate Complexity: 1 Mod PT Treatments $Self Care/Home Management: 8-22        Deborah Chalk, PT, DPT  Acute Rehabilitation Services Pager [phone] Office [phone]    Alessandra Bevels Mehmet Scally 12/04/2018, 3:09 PM

## 2018-12-04 NOTE — Progress Notes (Addendum)
Daily Progress Note   Patient Name: Mia Rogers       Date: 12/04/2018 DOB: October 09, 1928  Age: 85 y.o. MRN#: [id number] Attending Physician: Martyn Malay, MD Primary Care Physician: Jacklyn Shell, FNP Admit Date: 12/02/2018  Reason for Consultation/Follow-up: Establishing goals of care  Subjective:  Patient is resting in bed. She is alert and smiles, but does not speak to me. She has eaten and drank a little less than half of her breakfast. Son Randall Hiss at bedside upon arrival. He states Nicki Reaper is POA and has kept the family updated. [name]'s son is also at bedside.  Primary team in to speak with family, and stepped out prior to completion of meeting. We discussed her diagnosis, prognosis, GOC, EOL wishes disposition and options.  Randall Hiss states the family would like to focus on comfort. [name] arrived to bedside. He and Randall Hiss both state they have talked to their sister and that she has our phone number to call if she has questions.   A detailed discussion was had today regarding advanced directives.  Concepts specific to code status, artifical feeding and hydration, IV antibiotics and rehospitalization were discussed.  The difference between an aggressive medical intervention path and a comfort care path was discussed.  Values and goals of care important to patient and family were attempted to be elicited.  Discussed limitations of medical interventions to prolong quality of life for this patient at this time in this situation and discussed the concept of human mortality.  I completed a MOST form today and the signed original was placed in the chart. A photocopy was placed in the chart to be scanned into EMR. The patient outlined their wishes for the following treatment decisions:  Cardiopulmonary  Resuscitation: Do Not Attempt Resuscitation (DNR/No CPR)  Medical Interventions: Comfort Measures: Keep clean, warm, and dry. Use medication by any route, positioning, wound care, and other measures to relieve pain and suffering. Use oxygen, suction and manual treatment of airway obstruction as needed for comfort. Do not transfer to the hospital unless comfort needs cannot be met in current location.  Antibiotics: Determine use of limitation of antibiotics when infection occurs  IV Fluids: No IV fluids (provide other measures to ensure comfort)  Feeding Tube: No feeding tube   [name] produced HPOA papers which were photocopied with a copy added  to the chart.   Spoke with [facility] Costella PA. Requested discussion with family about risk vs benefit of not wearing c- collar as she is pulling at the collar. She is now in a place of focusing on comfort and the collar will significantly affect her quality of life.    Length of Stay: 2  Current Medications: Scheduled Meds:  . acetaminophen  325 mg Oral Q6H  . donepezil  10 mg Oral QHS  . levothyroxine  50 mcg Oral Once per day on Sun Mon Tue Thu Fri Sat  . potassium chloride  40 mEq Oral Once    Continuous Infusions:   PRN Meds:   Physical Exam Pulmonary:     Effort: Pulmonary effort is normal.  Neurological:     Mental Status: She is alert.     Comments: Smiling             Vital Signs: BP (!) 174/97 (BP Location: Right Arm) Comment: RN aware  Pulse 95   Temp 97.7 F (36.5 C) (Oral)   Resp 13   Ht '5\' 7"'  (1.702 m)   Wt 58 kg   SpO2 98%   BMI 20.03 kg/m  SpO2: SpO2: 98 % O2 Device: O2 Device: Room Air O2 Flow Rate:    Intake/output summary:   Intake/Output Summary (Last 24 hours) at 12/04/2018 1026 Last data filed at 12/03/2018 2300 Gross per 24 hour  Intake 324.29 ml  Output 700 ml  Net -375.71 ml   LBM: Last BM Date: 12/03/18 Baseline Weight: Weight: 58 kg Most recent weight: Weight: 58 kg       Palliative  Assessment/Data:      Patient Active Problem List   Diagnosis Date Noted  . Subdural hematoma ([place]) 12/02/2018  . Other encephalopathy   . Fall 10/01/2018  . Closed fracture of right olecranon process     Palliative Care Assessment & Plan    Recommendations/Plan:  Recommend nursing facility placement with hospice. At this time, she does not appear to qualify for hospice facility. Will reassess, if she declines, she would be a candidate for hospice facility.  [facility] now following. If patient declines neurologically or has poor PO intake, recommend transfer to hospice home when able. If she has adequate intake or improves, recommend long term nursing home placement with hospice. I have spoke with hospice liaison Olivia Mackie about this plan.     Code Status:    Code Status Orders  (From admission, onward)         Start     Ordered   12/04/18 1006  Do not attempt resuscitation (DNR)  Continuous    Question Answer Comment  In the event of cardiac or respiratory ARREST Do not call a "code blue"   In the event of cardiac or respiratory ARREST Do not perform Intubation, CPR, defibrillation or ACLS   In the event of cardiac or respiratory ARREST Use medication by any route, position, wound care, and other measures to relive pain and suffering. May use oxygen, suction and manual treatment of airway obstruction as needed for comfort.      12/04/18 1006        Code Status History    Date Active Date Inactive Code Status Order ID Comments User Context   12/02/2018 1656 12/04/2018 1006 Full Code [id number]  Marjie Skiff, MD ED   10/01/2018 1525 10/06/2018 2303 Full Code [id number]  Everrett Coombe, MD ED    Advance Directive Documentation  Most Recent Value  Type of Advance Directive  Out of facility DNR (pink MOST or yellow form)  Pre-existing out of facility DNR order (yellow form or pink MOST form)  -  "MOST" Form in Place?  -       Prognosis:   < 6 months  Bilateral SDH with nonop management due to age and frailty. Odontoid fx. Poor oral intake.     Care plan was discussed with [facility].  Thank you for allowing the Palliative Medicine Team to assist in the care of this patient.   Time In: 9:25 Time Out: 11:00 Total Time 1 hour 35 min Prolonged Time Billed  yes      Greater than 50%  of this time was spent counseling and coordinating care related to the above assessment and plan.  Asencion Gowda, NP  Please contact Palliative Medicine Team phone at [phone] for questions and concerns.

## 2018-12-04 NOTE — Progress Notes (Signed)
OT Cancellation Note  Patient Details Name: Mia Rogers MRN: [id number] DOB: 01/30/1928   Cancelled Treatment:    Reason Eval/Treat Not Completed: Other (comment): Family discussing goals of care at this time. Will check back and follow case as appropriate.   Syrita Dovel Marcy Panning, OTR/L Acute Rehabilitation Services Office [phone]   Zaelynn Fuchs A Mancel Lardizabal 12/04/2018, 1:42 PM

## 2018-12-04 NOTE — Progress Notes (Signed)
Patient son Mia Rogers at bedside asking to speak to doctor concerning patient code status.Dr.Olsen notified to come to bedside to speak with family.

## 2018-12-04 NOTE — Progress Notes (Signed)
CSW called by nurse to come speak with the family about Hospice. CSW went and spoke with the family and discussed all possible options are. CSW discussed private care options, skilled nursing with hospice to follow, or comfort care.   Family needs time to decide and also they are awaiting to see what the outcomes are going to be. Patient is remaining about the same.   CSW will continue to follow.   Drucilla Schmidt, MSW, LCSW-A Clinical Social Worker [employer]

## 2018-12-04 NOTE — Progress Notes (Addendum)
Family Medicine Teaching Service Daily Progress Note Intern Pager: [phone]  Patient name: Mia Rogers Medical record number: [id number] Date of birth: 07/16/1928 Age: 85 y.o. Gender: female  Primary Care Provider: Manus Gunning, FNP Consultants: neurosurgery Code Status: Full code  Pt Overview and Major Events to Date:  Hospital Day 2 Admitted: 12/02/2018  Assessment and Plan: Mia Rogers a 85 y.o.femalepresenting after an unwitnessedmechanical fall at nursing facilityand found to have large bilateralsubdural hematomas andaminimally displacedodontoid fracture. PMH is significant forhypothyroidism, essential tremorsanddementia.  SubduralHematoma s/p Mechanical Fall, acute on chronic: After discussion with Neurosurgeon, family has opted for non-surgical management. Spoke with son who is HCPOA who states the children would like to make her DNR.  Will need HCPOA proof or confirmation from one other sibling. They will continue to have discussion with palliative care next week regarding whether to seek comfort care measures. VSS, elevated BP, patient able to answer questions occasionally with yes/no, limited movement of right arm and bilateral legs. 4/5 strength left arm. SLP has cleared her for dysphagia 1 diet - f/u with son regarding code status/HCPOA paperwork.  - Neuro consulted, appreciate recs - Tylenol 350mg  suppository q6 hours, max dose per neurosurg <2g/day - Neuro checks q4 hours - Oxygen as needed  Hypertension: improving Holding home HTN meds.  SBP 174 this am.  - Continue to monitor BP  Hypokalemia: K+ 3.0 on admission, 3.2 this AM  - KDur  - AM BMP  Type 1 Cervical Dens Fracture: CT of the cervical spinesignificant for amildly angulated type I odontoid fracture.  - continue immobilization in Aspen cervical collar  Hypothyroidism Follows with PCP regularly for TSH labs. Home meds Synthroid everyday but Wednesday - restart  synthroid every day except wednesdays.   Dementia: chronic, stable Son, Hector Shade [phone]), is HCPOA. Home meds: Aricept 10mg  qHS and Seroquel 12.5mg  - restart aricept Hold seroquel while mental status improves. - scheduled tylenol for pain - Consider IV Haldol x1  if agitated  Essentialtremor- patient has a history of essential tremor affecting the head, neck and armsper chart review. Appreciated on exam this AM.Home meds; Propranolol 40mg  qAM - restart propranolol  FEN/GI: NPO pending SLP eval PPx: SCDs  Disposition: SNF    Medications: Scheduled Meds: . acetaminophen  325 mg Oral Q6H  . levothyroxine  50 mcg Oral Once per day on Sun Mon Tue Thu Fri Sat   Continuous Infusions: PRN Meds:   ================================================= ================================================= Subjective:  Patient states she is not having pain in head, chest, or abdomen.  Can only answer yes/no to some but not all of questions asked.   Objective: Temp:  [97.7 F (36.5 C)-99.1 F (37.3 C)] 97.7 F (36.5 C) (02/08 0822) Pulse Rate:  [74-104] 95 (02/08 0822) Resp:  [13-27] 13 (02/08 0822) BP: (151-183)/(83-105) 174/97 (02/08 0822) SpO2:  [96 %-99 %] 98 % (02/08 0822) Intake/Output 02/07 0701 - 02/08 0700 In: 324.3 [P.O.:150; I.V.:59.6; IV Piggyback:114.7] Out: 700 [Urine:700] Physical Exam:  Gen: laying in bed, wearing cervical collar.  Alert to person.  Can follow commands and answer yes/no questions, but not much more in terms of communication. HEENT: significant bruising on face from her fall.  EOMI.  CV: Regular rate and rhythm.  Radial pulses 2+ bilaterally. No bilateral lower extremity edema. Resp: Clear to auscultation bilaterally.  No wheezing, rales, abnormal lung sounds.  No increased work of breathing appreciated. Abd: Nontender and nondistended on palpation to all 4 quadrants.  Positive bowel sounds. MSK/neuro: 4/5 strength  left UE, 0/5  strength b/l lower extremities.  2/5 strength LUE. Lower jaw tremoring.  Psych: smiling.  Mumbling when trying to say more than yes/no.     Laboratory: Recent Labs  Lab 12/02/18 1027 12/03/18 0716 12/04/18 0429  WBC 6.4 9.2 7.3  HGB 10.9* 12.1 11.4*  HCT 34.0* 37.3 35.9*  PLT 292 311 281   Recent Labs  Lab 12/02/18 1027 12/03/18 0716 12/04/18 0429  NA 141 140 137  K 3.0* 3.3* 3.2*  CL 104 103 106  CO2 23 18* 18*  BUN 14 8 7*  CREATININE 0.79 0.80 0.76  CALCIUM 8.8* 8.9 8.4*  GLUCOSE 101* 106* 104*    Imaging/Diagnostic Tests: Ct Head Wo Contrast  Result Date: 12/02/2018 CLINICAL DATA:  Fall, head trauma EXAM: CT HEAD WITHOUT CONTRAST; CT CERVICAL SPINE WITHOUT CONTRAST TECHNIQUE: Contiguous axial images were obtained through the brain and cervical spine without intravenous contrast. COMPARISON:  10/01/2018 FINDINGS: CT HEAD FINDINGS Brain: There are large bilateral mixed attenuation subdural hemorrhages with internal fluid fluid levels, measuring 1.9 cm in maximum coronal thickness on the left and 1.6 cm in maximum coronal thickness on the right. There is substantial mass effect on the bilateral cerebral hemispheres and lateral ventricles without evidence of midline or downward shift. There is underlying small-vessel white matter hypodensity. Vascular: No hyperdense vessel or unexpected calcification. Skull: Normal. Negative for fracture or focal lesion. Sinuses/Orbits: No acute finding. Other: Large left frontal scalp hematoma. CT CERVICAL SPINE FINDINGS Alignment: Normal. Skull base and vertebrae: There is a minimally displaced type 1 dens fracture (series 13, image 30). No primary bone lesion or focal pathologic process. Soft tissues and spinal canal: No prevertebral fluid or swelling. No visible canal hematoma. Disc levels:  Severe multilevel disc and facet degenerative disease Upper chest: Negative. Other: None. IMPRESSION: 1. There are large bilateral mixed attenuation subdural  hemorrhages with internal fluid fluid levels, measuring 1.9 cm in maximum coronal thickness on the left and 1.6 cm in maximum coronal thickness on the right. There is substantial mass effect on the bilateral cerebral hemispheres and lateral ventricles without evidence of midline or downward shift. Although new compared to prior examination dated 10/01/2018, these appear to be acute on subacute to chronic suggesting multiple hemorrhage events in the interval. 2.  Small-vessel white matter disease. 3.  Minimally displaced type 1 dens fracture (series 13, image 30). 4. Severe multilevel disc and facet degenerative disease of the cervical spine. These results were called by telephone at the time of interpretation on 12/02/2018 at 10:57 am to Dr. Doug Sou ; Lorre Nick , who verbally acknowledged these results. Electronically Signed   By: Lauralyn Primes M.D.   On: 12/02/2018 10:57   Ct Cervical Spine Wo Contrast  Result Date: 12/02/2018 CLINICAL DATA:  Fall, head trauma EXAM: CT HEAD WITHOUT CONTRAST; CT CERVICAL SPINE WITHOUT CONTRAST TECHNIQUE: Contiguous axial images were obtained through the brain and cervical spine without intravenous contrast. COMPARISON:  10/01/2018 FINDINGS: CT HEAD FINDINGS Brain: There are large bilateral mixed attenuation subdural hemorrhages with internal fluid fluid levels, measuring 1.9 cm in maximum coronal thickness on the left and 1.6 cm in maximum coronal thickness on the right. There is substantial mass effect on the bilateral cerebral hemispheres and lateral ventricles without evidence of midline or downward shift. There is underlying small-vessel white matter hypodensity. Vascular: No hyperdense vessel or unexpected calcification. Skull: Normal. Negative for fracture or focal lesion. Sinuses/Orbits: No acute finding. Other: Large left frontal scalp hematoma. CT  CERVICAL SPINE FINDINGS Alignment: Normal. Skull base and vertebrae: There is a minimally displaced type 1 dens  fracture (series 13, image 30). No primary bone lesion or focal pathologic process. Soft tissues and spinal canal: No prevertebral fluid or swelling. No visible canal hematoma. Disc levels:  Severe multilevel disc and facet degenerative disease Upper chest: Negative. Other: None. IMPRESSION: 1. There are large bilateral mixed attenuation subdural hemorrhages with internal fluid fluid levels, measuring 1.9 cm in maximum coronal thickness on the left and 1.6 cm in maximum coronal thickness on the right. There is substantial mass effect on the bilateral cerebral hemispheres and lateral ventricles without evidence of midline or downward shift. Although new compared to prior examination dated 10/01/2018, these appear to be acute on subacute to chronic suggesting multiple hemorrhage events in the interval. 2.  Small-vessel white matter disease. 3.  Minimally displaced type 1 dens fracture (series 13, image 30). 4. Severe multilevel disc and facet degenerative disease of the cervical spine. These results were called by telephone at the time of interpretation on 12/02/2018 at 10:57 am to Dr. Doug SouKAITLYN ALBRIZZE ; Lorre NickANTHONY ALLEN , who verbally acknowledged these results. Electronically Signed   By: Lauralyn PrimesAlex  Bibbey M.D.   On: 12/02/2018 10:57      Sandre Kittylson, Jasir Rother K, MD 12/04/2018, 8:34 AM PGY-1, [facility] FPTS Intern pager: [phone], text pages welcome

## 2018-12-05 DIAGNOSIS — Z515 Encounter for palliative care: Secondary | ICD-10-CM

## 2018-12-05 LAB — BASIC METABOLIC PANEL
Anion gap: 15 (ref 5–15)
BUN: 10 mg/dL (ref 8–23)
CO2: 18 mmol/L — ABNORMAL LOW (ref 22–32)
Calcium: 9 mg/dL (ref 8.9–10.3)
Chloride: 105 mmol/L (ref 98–111)
Creatinine, Ser: 0.81 mg/dL (ref 0.44–1.00)
GFR calc Af Amer: >60 mL/min (ref >60)
Glucose, Bld: 118 mg/dL — ABNORMAL HIGH (ref 70–99)
Potassium: 3.3 mmol/L — ABNORMAL LOW (ref 3.5–5.1)
SODIUM: 138 mmol/L (ref 135–145)

## 2018-12-05 MED ORDER — BIOTENE DRY MOUTH MT LIQD: 15.0000 mL | OROMUCOSAL | Status: DC | PRN

## 2018-12-05 MED ORDER — SODIUM CHLORIDE 0.9 % IV SOLN: 250.0000 mL | INTRAVENOUS | Status: DC | PRN

## 2018-12-05 MED ORDER — MORPHINE SULFATE (CONCENTRATE) 10 MG/0.5ML PO SOLN: 5.0000 mg | ORAL | Status: DC | PRN

## 2018-12-05 MED ORDER — METOPROLOL TARTRATE 5 MG/5ML IV SOLN
INTRAVENOUS | Status: AC
  Filled 2018-12-05: qty 5

## 2018-12-05 MED ORDER — LORAZEPAM 1 MG PO TABS: 1.0000 mg | ORAL_TABLET | ORAL | Status: DC | PRN

## 2018-12-05 MED ORDER — POLYVINYL ALCOHOL 1.4 % OP SOLN
1.0000 [drp] | Freq: Four times a day (QID) | OPHTHALMIC | Status: DC | PRN
  Filled 2018-12-05: qty 15

## 2018-12-05 MED ORDER — SODIUM CHLORIDE 0.9% FLUSH: 3.0000 mL | INTRAVENOUS | Status: DC | PRN

## 2018-12-05 MED ORDER — HALOPERIDOL LACTATE 5 MG/ML IJ SOLN: 0.5000 mg | INTRAMUSCULAR | Status: DC | PRN

## 2018-12-05 MED ORDER — HYDRALAZINE HCL 20 MG/ML IJ SOLN
5.0000 mg | Freq: Once | INTRAMUSCULAR | Status: AC
  Administered 2018-12-05: 5 mg via INTRAVENOUS
  Filled 2018-12-05: qty 1

## 2018-12-05 MED ORDER — LORAZEPAM 2 MG/ML IJ SOLN: 1.0000 mg | INTRAMUSCULAR | Status: DC | PRN

## 2018-12-05 MED ORDER — HALOPERIDOL LACTATE 2 MG/ML PO CONC
0.5000 mg | ORAL | Status: DC | PRN
  Filled 2018-12-05: qty 0.3

## 2018-12-05 MED ORDER — METOPROLOL TARTRATE 5 MG/5ML IV SOLN
2.5000 mg | Freq: Once | INTRAVENOUS | Status: AC
  Administered 2018-12-05: 2.5 mg via INTRAVENOUS

## 2018-12-05 MED ORDER — SODIUM CHLORIDE 0.9% FLUSH
3.0000 mL | Freq: Two times a day (BID) | INTRAVENOUS | Status: DC
  Administered 2018-12-05 – 2018-12-06 (×2): 3 mL via INTRAVENOUS

## 2018-12-05 MED ORDER — ACETAMINOPHEN 650 MG RE SUPP
650.0000 mg | RECTAL | Status: DC | PRN
  Administered 2018-12-05: 650 mg via RECTAL
  Filled 2018-12-05: qty 1

## 2018-12-05 MED ORDER — MORPHINE SULFATE (PF) 2 MG/ML IV SOLN
1.0000 mg | INTRAVENOUS | Status: DC | PRN
  Administered 2018-12-05 – 2018-12-06 (×2): 1 mg via INTRAVENOUS
  Filled 2018-12-05 (×2): qty 1

## 2018-12-05 MED ORDER — HALOPERIDOL 0.5 MG PO TABS
0.5000 mg | ORAL_TABLET | ORAL | Status: DC | PRN
  Filled 2018-12-05: qty 1

## 2018-12-05 MED ORDER — LORAZEPAM 2 MG/ML PO CONC: 1.0000 mg | ORAL | Status: DC | PRN

## 2018-12-05 NOTE — Progress Notes (Signed)
Family Medicine Teaching Service Daily Progress Note Intern Pager: [phone]  Patient name: Mia Rogers Medical record number: [id number] Date of birth: 07-21-1928 Age: 85 y.o. Gender: female  Primary Care Provider: Manus Gunning, FNP Consultants: neurosurgery Code Status: Full code  Pt Overview and Major Events to Date:  Hospital Day 3 Admitted: 12/02/2018  Assessment and Plan: Manie Scotto a 85 y.o.femalepresenting after an unwitnessedmechanical fall at nursing facilityand found to have large bilateralsubdural hematomas andaminimally displacedodontoid fracture. PMH is significant forhypothyroidism, essential tremorsanddementia.  SubduralHematoma s/p Mechanical Fall, acute on chronic: O/n rapid response called on pt due to being unresponsive. Patient is rapidly declining. Currently unresponsive, tachycardic, hypertensive.  Extensive conversation with healthcare power of attorney, the son Lorin Picket.  Plan transition patient full comfort care.  Patient has a bed awaiting her at [facility].  It will be available on morning of 2/10. Neurosurgery is following, and family will continue to have discussions on whether to continue to use the Aspen collar or have it replaced with a soft collar.  Family is wishing p.o. medications be stopped except for propranolol is used for patient's essential tremor. -Comfort care order set, discontinue p.o. medications except for propranolol -Discontinue vital signs - Neurosurgery consulted, appreciate recs  Type 1 Cervical Dens Fracture: CT of the cervical spinesignificant for amildly angulated type I odontoid fracture.  Neurosurgery is following and plans to present soft collar to family which is contemplating removing the Aspen collar.   - cont Aspen collar -Neurosurgery recommendations  FEN/GI: Dysphagia 1 PPx:  None  Disposition: Comfort care with hospice transportation tomorrow   Medications: Scheduled Meds: .  acetaminophen  325 mg Oral Q6H  . donepezil  10 mg Oral QHS  . levothyroxine  50 mcg Oral Once per day on Sun Mon Tue Thu Fri Sat  . propranolol  40 mg Oral Q0600   Continuous Infusions: PRN Meds:   ================================================= ================================================= Subjective:  Unable to obtain as patient is nonverbal and only tracking with eyes.  Objective: Temp:  [97.7 F (36.5 C)-99.4 F (37.4 C)] (P) 99.4 F (37.4 C) (02/09 0800) Pulse Rate:  [79-122] (P) 122 (02/09 0800) Resp:  [16-22] (P) 22 (02/09 0800) BP: (160-190)/(93-126) (P) 181/111 (02/09 0800) SpO2:  [96 %-100 %] (P) 99 % (02/09 0800) Intake/Output 02/08 0701 - 02/09 0700 In: -  Out: 600 [Urine:600] Physical Exam:  Gen: laying in bed, wearing cervical collar.  Nonverbal, tracks eyes, would not follow commands  CV: Regular rate and rhythm.  Resp: Clear to auscultation bilaterally.  Normal work of breathing Abd: Nontender and nondistended  MSK/neuro: Eyes tracking, PERRLA   Laboratory: Recent Labs  Lab 12/02/18 1027 12/03/18 0716 12/04/18 0429  WBC 6.4 9.2 7.3  HGB 10.9* 12.1 11.4*  HCT 34.0* 37.3 35.9*  PLT 292 311 281   Recent Labs  Lab 12/03/18 0716 12/04/18 0429 12/05/18 0641  NA 140 137 138  K 3.3* 3.2* 3.3*  CL 103 106 105  CO2 18* 18* 18*  BUN 8 7* 10  CREATININE 0.80 0.76 0.81  CALCIUM 8.9 8.4* 9.0  GLUCOSE 106* 104* 118*    Imaging/Diagnostic Tests: No results found.    Garnette Gunner, MD 12/05/2018, 11:24 AM PGY-2, [facility] FPTS Intern pager: [phone], text pages welcome

## 2018-12-05 NOTE — Progress Notes (Signed)
Orthopedic Tech Progress Note Patient Details:  Mia Rogers [birth year] [id number]  Ortho Devices Type of Ortho Device: Soft collar Ortho Device/Splint Interventions: Application   Post Interventions Patient Tolerated: Well Instructions Provided: Care of device   Saul Fordyce 12/05/2018, 6:14 PM

## 2018-12-05 NOTE — Progress Notes (Signed)
[facility]W-[facility] [place] RN Visit @ 1000  Received request from [place], LCSW for family interest in [place] with request to transfer Monday. Chart reviewed and eligibility has been approved. Met with Nicki Reaper, son, to confirm interest and explain services. Family agreeable to transfer Monday 12/06/18. Caitlyn, LCSW aware. Registration paper work completed. Dr. Tomasa Hosteller to assume care per family request.  Please fax discharge summary to [phone].  RN please call report to [phone].  Please arrange for transport for patient to arrive before noon if possible.  Thank you, Margaretmary Eddy, RN, [facility] Liaison [phone]  [place] are on AMION.

## 2018-12-05 NOTE — Progress Notes (Signed)
OT Cancellation Note  Patient Details Name: Mia Rogers MRN: [id number] DOB: [birth year]   Cancelled Treatment:    Reason Eval/Treat Not Completed: Other (comment). Spoke to patients son in regard of goals of care.  Son reports plan for comfort care at this time, reviewed positioning in bed to prevent pressure sores.  At this time, no further OT needs necessary.  OT signing off.  If further needs arise, please re-consult.   Chancy Milroy, OT Acute Rehabilitation Services Pager [phone] Office [phone]    Chancy Milroy 12/05/2018, 10:09 AM

## 2018-12-05 NOTE — Discharge Summary (Signed)
Family Medicine Teaching [facility] Discharge Summary  Patient name: Mia Rogers Medical record number: [id number] Date of birth: 09/08/1928 Age: 85 y.o. Gender: female Date of Admission: 12/02/2018  Date of Discharge: 12/06/2018 Admitting Physician: Westley Chandler, MD  Primary Care Provider: Manus Gunning, FNP Consultants: Neurosurgery  Indication for Hospitalization: Mechanical Fall, Bilateral Subdural Hematoma,   Discharge Diagnoses/Problem List:  Subdural Hematoma Cervical Dens Fracture Mechanical Fall Hypothyroidism Essential Tremor Dementia   Disposition: [facility]   Discharge Condition: Stable  Discharge Exam:  BP 137/85 (BP Location: Left Arm)   Pulse (!) 116   Temp 99.1 F (37.3 C) (Axillary)   Resp 18   Ht 5\' 7"  (1.702 m)   Wt 58 kg   SpO2 96%   BMI 20.03 kg/m  Gen: eyes are open but unsure if she is aware of my presence.  Patient not responsive to questioning.  CV: tachycardic.   Radial pulses 2+ bilaterally. No bilateral lower extremity edema. Resp: Clear to auscultation bilaterally.  No wheezing, rales, abnormal lung sounds.  No increased work of breathing appreciated. Abd: Nontender and nondistended on palpation to all 4 quadrants.  Positive bowel sounds.  Brief Hospital Course:  Mia Rogers is a 85 y.o. female with past medical history significant for hypothyroidism, essential tremorsanddementia, who presented after an unwittnessed fall at her nursing facility and found to have a large bilateral subdural hematoma and a minimally displaced odontoid fracture. Hospital course outlined below.  Acute on Chronic Bilateral Subdural Hematoma: Patient presented after several unwitnessed falls at nursing home with CT head significant for acute on chronic large bilateral mixed density subdural hematomas with mass effect without evidence of midline or downward shift. On presentation she was alert but only oriented to self with benign  neuro exam. Neurosurgery was consulted and after lengthy discussion, family opted not to pursue surgical intervention given unlikely substantial improvement with surgeyr. Palliative care was consulted and on 2/8 patient was transitioned to full comfort care and made DNI/DNR.  All medications were discontinued except her propranolol per family request.  Speech therapy was consulted during admission for swallow evaluation and patient was placed on a Dysphagia 1 diet. On 2/8 patient had an acute worsening of mental status which she has not recovered from.  She has been mostly unresponsive since that time. Patient was discharged to [facility] for hospice care on 2/10.   Type 1 Cervical Dens Fracture: Patient also suffered a minimally displaced type 1 dens fracture. She was initially placed in a Aspen cervical collar and instructed to wear indefinitely per neurosurgery recommendations. However, this was causing the patient some discomfort and thus was transitioned to a soft collar. Patient will be put on   Issues for Follow Up:  1. Follow comfort care protocol.  2. Put soft collar back on after transportation if hard collar was used during transfer.   Significant Procedures: None  Significant Labs and Imaging:  Recent Labs  Lab 12/02/18 1027 12/03/18 0716 12/04/18 0429  WBC 6.4 9.2 7.3  HGB 10.9* 12.1 11.4*  HCT 34.0* 37.3 35.9*  PLT 292 311 281   Recent Labs  Lab 12/02/18 1027 12/02/18 1832 12/03/18 0716 12/04/18 0429 12/05/18 0641  NA 141  --  140 137 138  K 3.0*  --  3.3* 3.2* 3.3*  CL 104  --  103 106 105  CO2 23  --  18* 18* 18*  GLUCOSE 101*  --  106* 104* 118*  BUN 14  --  8 7* 10  CREATININE 0.79  --  0.80 0.76 0.81  CALCIUM 8.8*  --  8.9 8.4* 9.0  MG  --  1.8  --   --   --    Urinalysis    Component Value Date/Time   COLORURINE YELLOW 12/02/2018 1247   APPEARANCEUR HAZY (A) 12/02/2018 1247   LABSPEC 1.014 12/02/2018 1247   PHURINE 6.0 12/02/2018 1247   GLUCOSEU  NEGATIVE 12/02/2018 1247   HGBUR NEGATIVE 12/02/2018 1247   BILIRUBINUR NEGATIVE 12/02/2018 1247   KETONESUR 20 (A) 12/02/2018 1247   PROTEINUR NEGATIVE 12/02/2018 1247   NITRITE NEGATIVE 12/02/2018 1247   LEUKOCYTESUR TRACE (A) 12/02/2018 1247   PT/INR: 14.1/1.10 PTT: 29 Mag 1.8 BNP: 126 Trop: <0.03  Ct Head and Cervical Spine Wo Contrast -  12/02/2018 IMPRESSION: 1. There are large bilateral mixed attenuation subdural hemorrhages with internal fluid fluid levels, measuring 1.9 cm in maximum coronal thickness on the left and 1.6 cm in maximum coronal thickness on the right. There is substantial mass effect on the bilateral cerebral hemispheres and lateral ventricles without evidence of midline or downward shift. Although new compared to prior examination dated 10/01/2018, these appear to be acute on subacute to chronic suggesting multiple hemorrhage events in the interval. 2.  Small-vessel white matter disease. 3.  Minimally displaced type 1 dens fracture (series 13, image 30). 4. Severe multilevel disc and facet degenerative disease of the cervical spine.   Results/Tests Pending at Time of Discharge: None  Discharge Medications:  Allergies as of 12/06/2018   No Known Allergies     Medication List    STOP taking these medications   acetaminophen 500 MG tablet Commonly known as:  TYLENOL   DAILY VITE Tabs   donepezil 10 MG tablet Commonly known as:  ARICEPT   levothyroxine 50 MCG tablet Commonly known as:  SYNTHROID, LEVOTHROID   QUEtiapine 25 MG tablet Commonly known as:  SEROQUEL     TAKE these medications   haloperidol 0.5 MG tablet Commonly known as:  HALDOL Take 1 tablet (0.5 mg total) by mouth every 4 (four) hours as needed for agitation (or delirium).   haloperidol 2 MG/ML solution Commonly known as:  HALDOL Place 0.3 mLs (0.6 mg total) under the tongue every 4 (four) hours as needed for agitation (or delirium).   haloperidol lactate 5 MG/ML injection Commonly  known as:  HALDOL Inject 0.1 mLs (0.5 mg total) into the vein every 4 (four) hours as needed (or delirium).   LORazepam 1 MG tablet Commonly known as:  ATIVAN Take 1 tablet (1 mg total) by mouth every 4 (four) hours as needed for anxiety.   LORazepam 2 MG/ML concentrated solution Commonly known as:  ATIVAN Place 0.5 mLs (1 mg total) under the tongue every 4 (four) hours as needed for anxiety.   LORazepam 2 MG/ML injection Commonly known as:  ATIVAN Inject 0.5 mLs (1 mg total) into the vein every 4 (four) hours as needed for anxiety.   morphine 2 MG/ML injection Inject 0.5 mLs (1 mg total) into the vein every 2 (two) hours as needed (or dyspnea).   morphine CONCENTRATE 10 MG/0.5ML Soln concentrated solution Take 0.25 mLs (5 mg total) by mouth every 2 (two) hours as needed for moderate pain (or dyspnea).   morphine CONCENTRATE 10 MG/0.5ML Soln concentrated solution Place 0.25 mLs (5 mg total) under the tongue every 2 (two) hours as needed for moderate pain (or dyspnea).   propranolol 40 MG tablet Commonly known  as:  INDERAL Take 1 tablet (40 mg total) by mouth daily as needed (for essential tremor). What changed:    when to take this  reasons to take this       Discharge Instructions: Please refer to Patient Instructions section of EMR for full details.  Patient was counseled important signs and symptoms that should prompt return to medical care, changes in medications, dietary instructions, activity restrictions, and follow up appointments.   Follow-Up Appointments:   Sandre Kittylson, Arran Fessel K, MD 12/06/2018, 9:55 AM PGY-1, [facility]

## 2018-12-05 NOTE — Significant Event (Signed)
Rapid Response Event Note  Overview: Time Called: 0629 Arrival Time: 0629 Event Type: MEWS    Vital Signs MEWS/VS Documentation      12/05/2018 0427 12/05/2018 0429 12/05/2018 0500 12/05/2018 0621   MEWS Score:  1  2  3  5    MEWS Score Color:  Green  Yellow  Yellow  Red   Resp:  -  -  17  (!) 22   Pulse:  -  -  (!) 121  (!) 115   BP:  (!) 190/126  -  (!) 179/126  (!) 179/118   Temp:  97.7 F (36.5 C)  -  -  -   O2 Device:  Room Air  -  Room [employer]   Level of Consciousness:  -  Responds to Pain  -  Rose Fillers 12/05/2018,6:30 AM Rose Fillers

## 2018-12-05 NOTE — Progress Notes (Signed)
CSW received a phone call from the MD. Patient has declined and is not responsive. MD wanted to know that if they patient was able to go to [facility] today. CSW stated that she could call French Anaracy [facility] and clarify.   CSW called and spoke with French Anaracy from Hospice. Patient does meet the criteria for [facility]. They do not have a bed available for today but will have one first thing in the morning. French Anaracy is completing paperwork with the family today, she also asked to have MD complete discharge summary.   CSW called and informed MD that the patient will be going to [facility] on Monday. CSW will need to call for room number and then arrange transport.   CSW will continue to follow.   Drucilla Schmidtaitlin Deniyah Dillavou, MSW, LCSW-A Clinical Social Worker [facility]

## 2018-12-05 NOTE — Progress Notes (Signed)
  NEUROSURGERY PROGRESS NOTE   Patient now now unresponsive, hypertensive, tachycardic. Son, Capron, at bedside. We discussed her current situation at length. He specifically wanted to discuss the aspen C collar for her cervical fracture as he believes it is causing some discomfort. I explained the necessity of the collar as well as the risks of removal of the collar. I discussed  Possible worsening of the fracture and ultimately what this could cause. Son is going to talk to the rest of the family regarding our discussion. I have ordered a soft collar as he may want to switch to that rather than completely d/c stabilization all together. Dr Newell Coral to follow up tomorrow.

## 2018-12-06 MED ORDER — LORAZEPAM 2 MG/ML PO CONC: 1.0000 mg | ORAL | 0 refills | Status: AC | PRN

## 2018-12-06 MED ORDER — LORAZEPAM 1 MG PO TABS: 1.0000 mg | ORAL_TABLET | ORAL | 0 refills | Status: AC | PRN

## 2018-12-06 MED ORDER — MORPHINE SULFATE (PF) 2 MG/ML IV SOLN: 1.0000 mg | INTRAVENOUS | 0 refills | Status: AC | PRN

## 2018-12-06 MED ORDER — HALOPERIDOL LACTATE 2 MG/ML PO CONC: 0.6000 mg | ORAL | 0 refills | Status: AC | PRN

## 2018-12-06 MED ORDER — HALOPERIDOL 0.5 MG PO TABS: 0.5000 mg | ORAL_TABLET | ORAL | Status: AC | PRN

## 2018-12-06 MED ORDER — PROPRANOLOL HCL 40 MG PO TABS: 40.0000 mg | ORAL_TABLET | Freq: Every day | ORAL | Status: AC | PRN

## 2018-12-06 MED ORDER — HALOPERIDOL LACTATE 5 MG/ML IJ SOLN: 0.5000 mg | INTRAMUSCULAR | Status: AC | PRN

## 2018-12-06 MED ORDER — MORPHINE SULFATE (CONCENTRATE) 10 MG/0.5ML PO SOLN: 5.0000 mg | ORAL | Status: AC | PRN

## 2018-12-06 MED ORDER — LORAZEPAM 2 MG/ML IJ SOLN: 1.0000 mg | INTRAMUSCULAR | 0 refills | Status: AC | PRN

## 2018-12-06 NOTE — Progress Notes (Signed)
Discharge to: [facility] Anticipated discharge date: 12/06/18 Transportation by: [facility]  CSW signing off.  Blenda Nicely LCSW [phone]

## 2018-12-06 NOTE — Plan of Care (Signed)
  Problem: Nutrition: Goal: Adequate nutrition will be maintained Outcome: Not Progressing   Problem: Coping: Goal: Level of anxiety will decrease Outcome: Progressing

## 2018-12-06 NOTE — Progress Notes (Addendum)
[facility] room 11 is available this morning for Mia Rogers. Paper work completed with son Lorin Picket yesterday afternoon. Dr. Kern Reap to assume care. Spoke with CSW Marisue Ivan to provide room number.    Please send discharge summary to [phone].  RN please call report to [phone] prior to patient leaving the unit.   Thank you,  Forrestine Him, LCSW [phone]

## 2018-12-06 NOTE — Progress Notes (Signed)
Nutrition Brief Note  Pt identified by low braden score. Chart reviewed. Pt currently on comfort care. No nutrition interventions warranted at this time. Please re-consult as needed.   Roslyn Smiling, MS, RD, LDN Pager # [phone] After hours/ weekend pager # [phone]

## 2018-12-06 NOTE — Progress Notes (Signed)
Patient discharging to [facility]. All belongings sent with patient family. Nurse called report

## 2018-12-06 NOTE — Progress Notes (Signed)
SLP Cancellation Note  Patient Details Name: Arlyle Strate MRN: [id number] DOB: Mar 26, 1928   Cancelled treatment:       Reason Eval/Treat Not Completed: Other (comment)(pt comfort care)   Chales Abrahams 12/06/2018, 7:42 AM  Donavan Burnet, MS [facility] SLP Acute Rehab Services Pager [phone] Office [phone]

## 2018-12-06 NOTE — Progress Notes (Signed)
Vitals:   12/05/18 0621 12/05/18 0800 12/05/18 1150 12/05/18 1605  BP: (!) 179/118 (!) 181/111 (!) 165/102 137/85  Pulse: (!) 115 (!) 122 (!) 134 (!) 116  Resp: (!) 22 (!) 22 18 18   Temp:  99.4 F (37.4 C) 99.9 F (37.7 C) 99.1 F (37.3 C)  TempSrc:  Axillary Axillary Axillary  SpO2: 99% 99% 96% 96%  Weight:      Height:        CBC Recent Labs    12/04/18 0429  WBC 7.3  HGB 11.4*  HCT 35.9*  PLT 281   BMET Recent Labs    12/04/18 0429 12/05/18 0641  NA 137 138  K 3.2* 3.3*  CL 106 105  CO2 18* 18*  GLUCOSE 104* 118*  BUN 7* 10  CREATININE 0.76 0.81  CALCIUM 8.4* 9.0    Patient seen at the bedside with her sons [place] and Minerva Areola.  They explained that she has had little in the way of p.o. intake of fluids or nutrition.  She has been transitioned to comfort care and they explain that she is being transferred to [facility] for hospice care.  She was changed from the Aspen collar to a soft collar, which is reasonable.  Patient resting comfortably in her bed.  Opens eyes to voice.  When asked her name, she appears to try to speak, but no sound is heard.  Occasional spontaneous movement of extremities, left more than right.  Plan: Patient has clearly continued to decline neurologically since admission.  Agree with current management.  Her son's questions were answered for them.  Will sign off.  Appreciate the assistance of the [facility] family practice service in the care of this patient.  Hewitt Shorts, MD 12/06/2018, 9:09 AM

## 2018-12-26 DEATH — deceased

## 2020-02-17 IMAGING — CT CT HEAD W/O CM
5 of 11 series · 16 of 47 positions shown, 18 images · non-contrast
Comparison: None.

CLINICAL DATA: 89-year-old female with head, neck and facial pain
following fall. RIGHT facial hematoma.

EXAM:
CT HEAD WITHOUT CONTRAST
CT MAXILLOFACIAL WITHOUT CONTRAST
CT CERVICAL SPINE WITHOUT CONTRAST
TECHNIQUE: Multidetector CT imaging of the head, cervical spine, and
maxillofacial structures were performed using the standard protocol
without intravenous contrast. Multiplanar CT image reconstructions
of the cervical spine and maxillofacial structures were also
generated.

[Series 6: head 3.0 mpr cor · coronal · 0.31mm/px · 2 of 70 slices shown]
[im 24/70  brain]
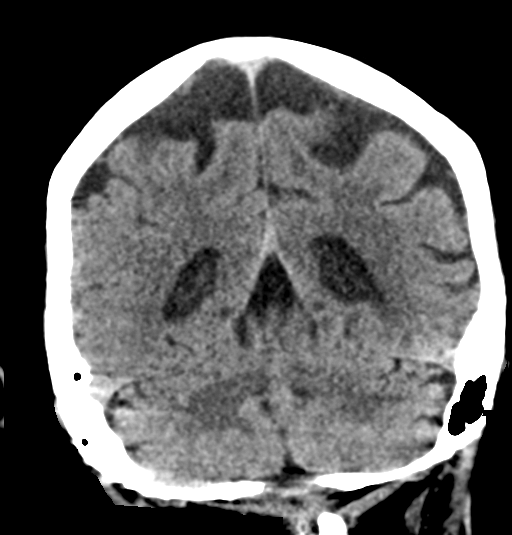
[im 47/70  brain]
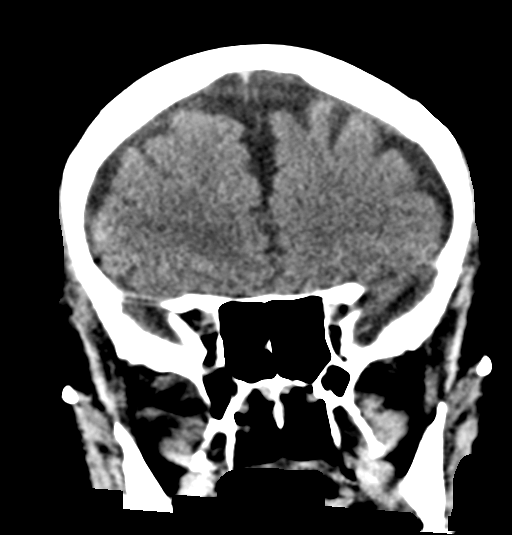

[Series 8: facial/ orbits 2.0 h30s · axial · 0.36mm/px · z∈[-168,-72]mm · 4 of 80 slices shown]
[im 16/80  brain]
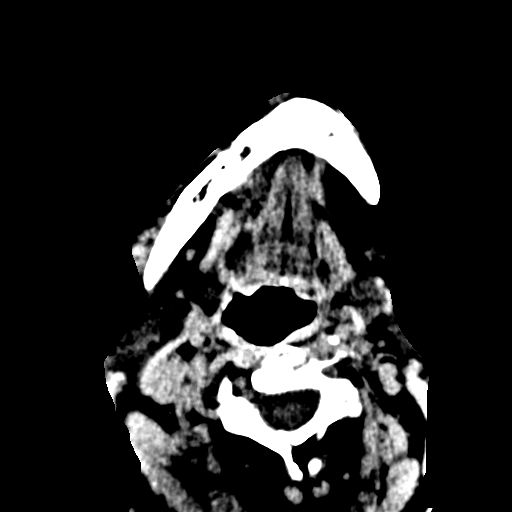
[im 32/80  brain]
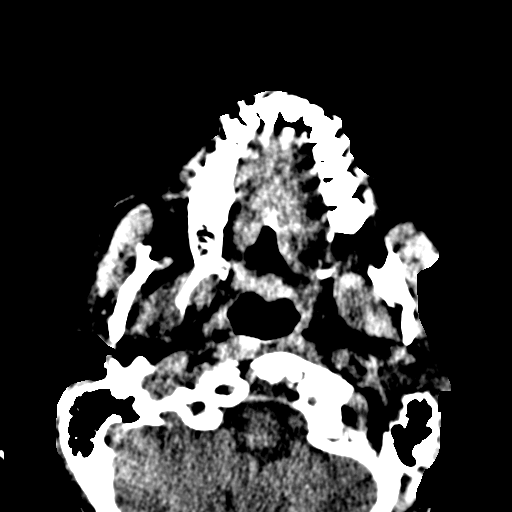
[im 48/80  brain]
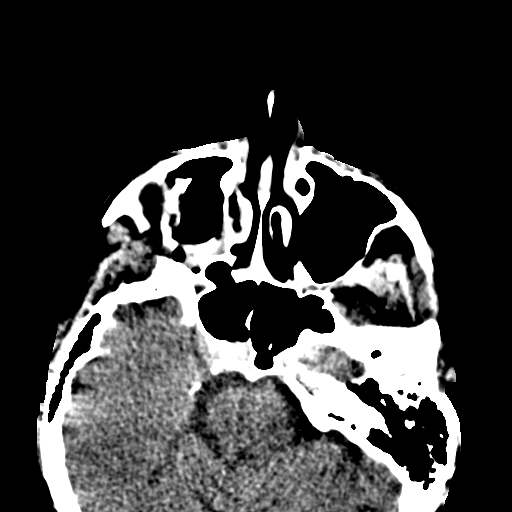
[im 64/80  brain]
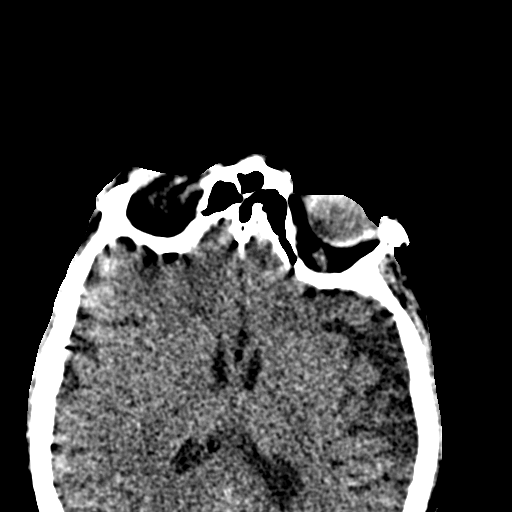

[Series 13: sagittal soft tissue · sagittal · 0.31mm/px · 1 of 75 slices shown]
[im 38/75  brain]
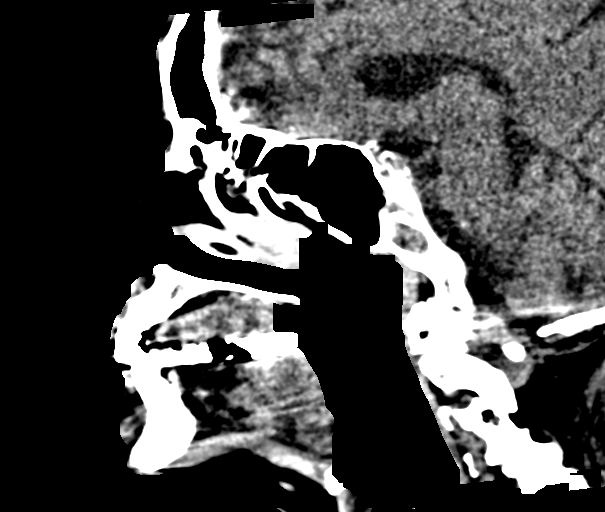

[Series 20: orthogonal axials bone · axial · 0.21mm/px · z∈[-278,-196]mm · 4 of 94 slices shown]
[im 16/94  bone]
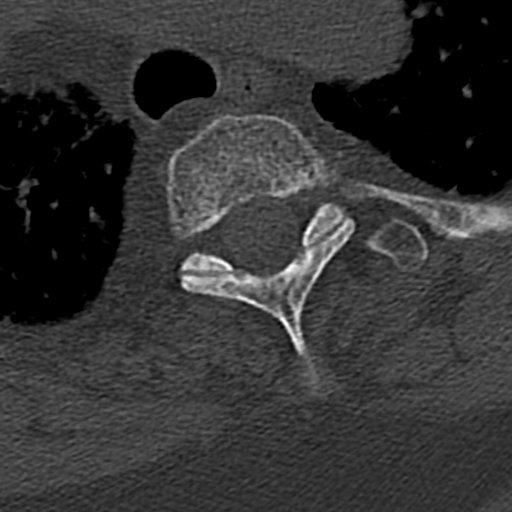
[im 32/94  bone]
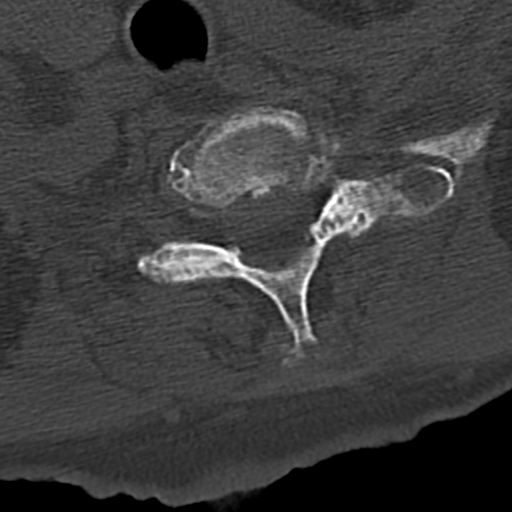
[im 47/94  bone]
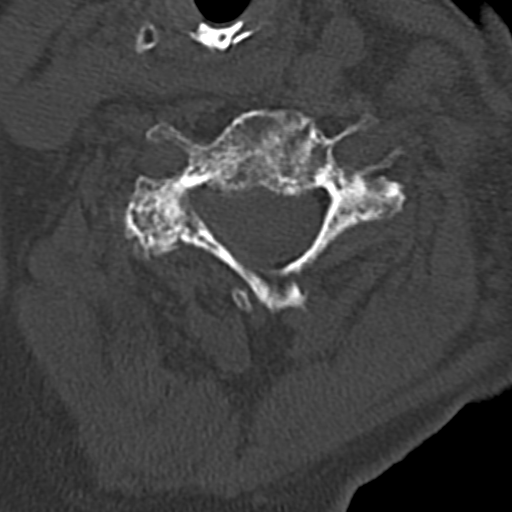
[im 63/94  bone]
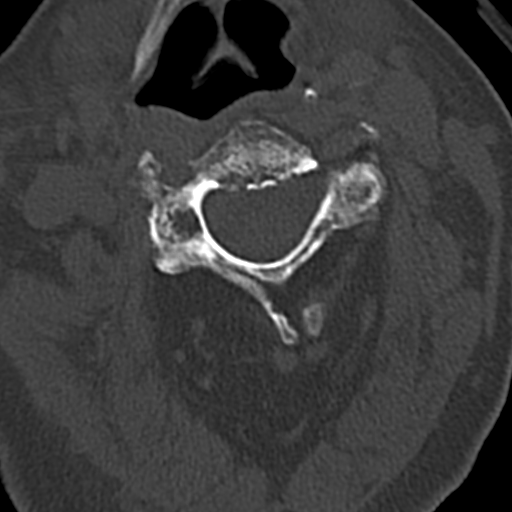

[Series 21: orthogonal axials st · axial · 0.21mm/px · z∈[-278,-166]mm · 5 of 94 slices shown, 7 images]
[im 16/94  brain]
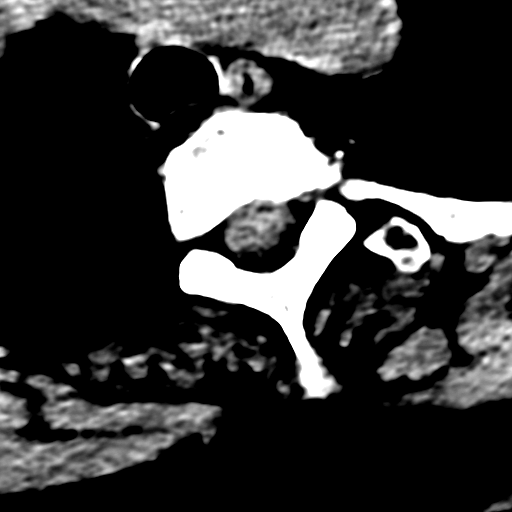
[im 16/94  bone]
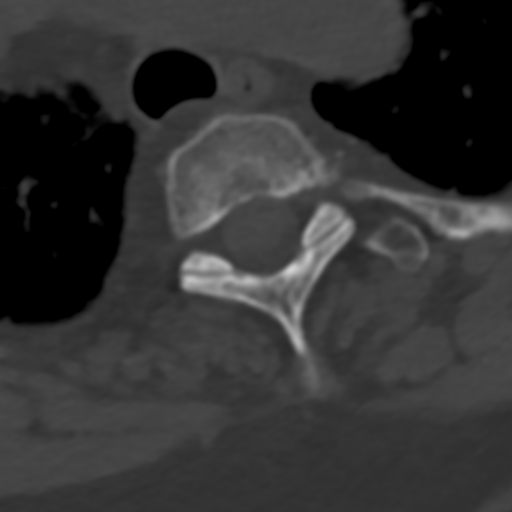
[im 32/94  brain]
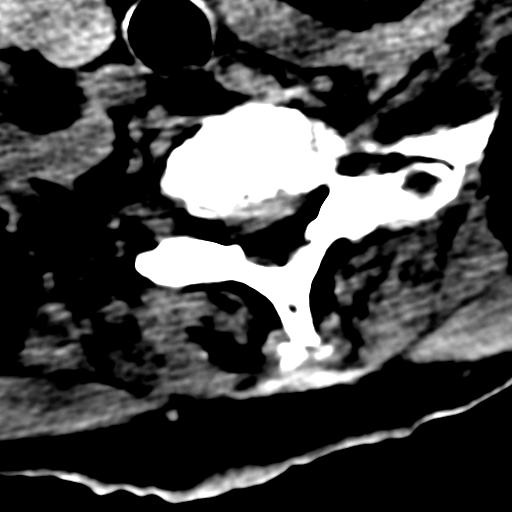
[im 47/94  brain]
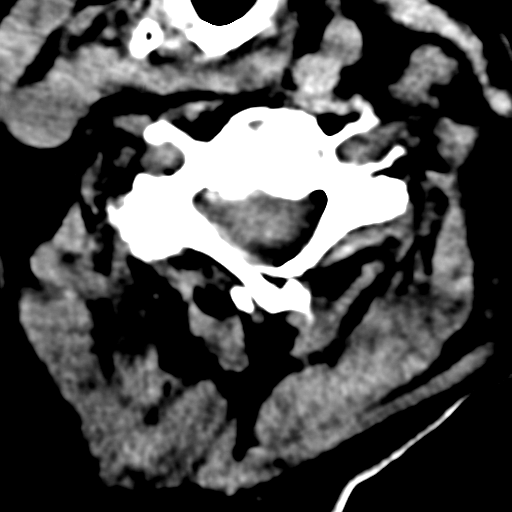
[im 63/94  brain]
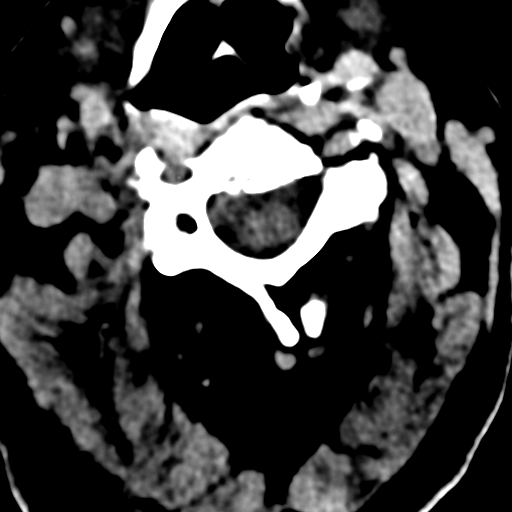
[im 78/94  brain]
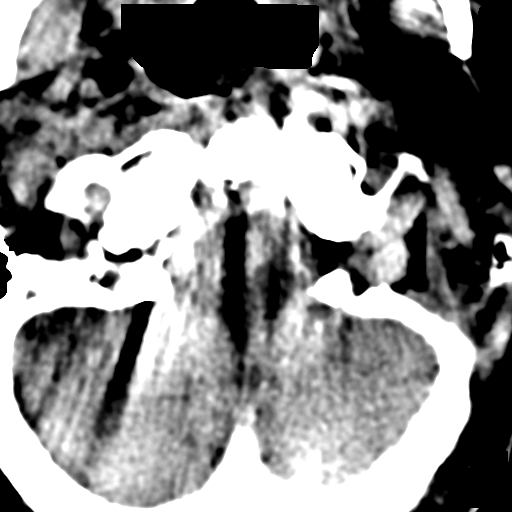
[im 78/94  bone]
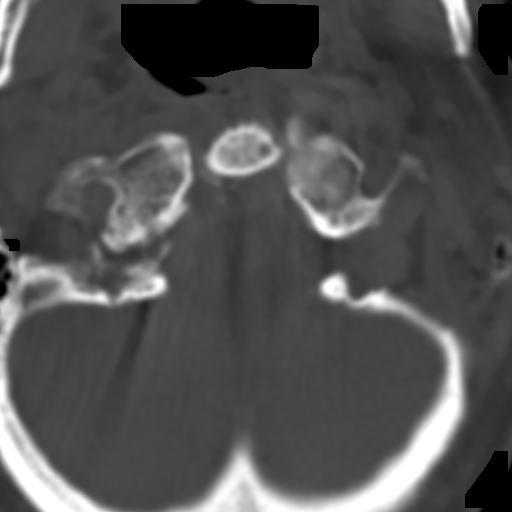

[16 of 47 positions shown; findings below may reference images not displayed]

FINDINGS: CT HEAD FINDINGS

Brain: No evidence of acute infarction, hemorrhage, hydrocephalus,
extra-axial collection or mass lesion/mass effect.

Atrophy and mild probable chronic small-vessel white matter ischemic
changes noted.

Vascular: Carotid atherosclerotic calcifications noted.

Skull: No acute calvarial abnormality identified.

Other: RIGHT forehead soft tissue swelling identified.

CT MAXILLOFACIAL FINDINGS

Osseous: Fractures of the ANTERIOR, LATERAL and MEDIAL RIGHT
maxillary sinus walls noted. There is 1.5 mm displacement of the
ANTERIOR RIGHT maxillary sinus wall fracture. The other fractures
are nondisplaced.

No other acute fracture, subluxation or dislocation identified.

Orbits: Negative. No traumatic or inflammatory finding.

Sinuses: Fluid in the RIGHT maxillary and bilateral sphenoid sinuses
noted.

Soft tissues: RIGHT facial soft tissue swelling identified.

CT CERVICAL SPINE FINDINGS

Alignment: Normal.

Skull base and vertebrae: No acute fracture. No primary bone lesion
or focal pathologic process.

Soft tissues and spinal canal: No prevertebral fluid or swelling. No
visible canal hematoma.

Disc levels: Moderate to severe multilevel degenerative disc disease
and facet arthropathy noted.

Upper chest: No acute abnormality

Other: None
IMPRESSION: 1. No evidence of acute intracranial abnormality. Atrophy and
probable mild chronic small-vessel white matter ischemic changes.
2. Fractures of the ANTERIOR, LATERAL and MEDIAL walls of the RIGHT
maxillary sinus with overlying soft tissue swelling.
3. Small amount of fluid within the sphenoid sinuses without
sphenoid or temporal bone fractures identified.
4. No static evidence of acute injury to the cervical spine.
Moderate to severe multilevel degenerative changes.
